# Patient Record
Sex: Female | Born: 1960 | Race: White | Hispanic: No | Marital: Married | Smoking: Never smoker
Health system: Southern US, Community
[De-identification: ages and names within clinical notes are randomized; demographics above are authoritative.]

## PROBLEM LIST (undated history)

## (undated) HISTORY — PX: ABDOMINAL HYSTERECTOMY: SHX81

## (undated) HISTORY — PX: PARATHYROIDECTOMY: SHX19

---

## 1998-06-23 ENCOUNTER — Other Ambulatory Visit: Admission: RE | Admit: 1998-06-23 | Discharge: 1998-06-23 | Payer: Self-pay | Admitting: Obstetrics and Gynecology

## 1998-07-11 ENCOUNTER — Encounter: Payer: Self-pay | Admitting: Obstetrics and Gynecology

## 1998-07-13 ENCOUNTER — Observation Stay (HOSPITAL_COMMUNITY): Admission: AD | Admit: 1998-07-13 | Discharge: 1998-07-14 | Payer: Self-pay | Admitting: Obstetrics and Gynecology

## 1999-10-02 ENCOUNTER — Encounter: Payer: Self-pay | Admitting: Obstetrics and Gynecology

## 1999-10-02 ENCOUNTER — Ambulatory Visit (HOSPITAL_COMMUNITY): Admission: RE | Admit: 1999-10-02 | Discharge: 1999-10-02 | Payer: Self-pay | Admitting: Obstetrics and Gynecology

## 2018-06-11 HISTORY — PX: REPLACEMENT TOTAL KNEE: SUR1224

## 2020-08-24 DIAGNOSIS — Z96651 Presence of right artificial knee joint: Secondary | ICD-10-CM | POA: Insufficient documentation

## 2020-08-24 DIAGNOSIS — S0501XA Injury of conjunctiva and corneal abrasion without foreign body, right eye, initial encounter: Secondary | ICD-10-CM | POA: Insufficient documentation

## 2020-08-24 DIAGNOSIS — B028 Zoster with other complications: Secondary | ICD-10-CM | POA: Insufficient documentation

## 2020-08-24 DIAGNOSIS — Z90710 Acquired absence of both cervix and uterus: Secondary | ICD-10-CM | POA: Insufficient documentation

## 2020-11-26 ENCOUNTER — Emergency Department (HOSPITAL_COMMUNITY)
Admission: EM | Admit: 2020-11-26 | Discharge: 2020-11-27 | Disposition: A | Discharge status: Home / Self Care | Payer: BC Managed Care – PPO | Attending: Emergency Medicine | Admitting: Emergency Medicine

## 2020-11-26 ENCOUNTER — Encounter (HOSPITAL_COMMUNITY): Payer: Self-pay | Admitting: *Deleted

## 2020-11-26 ENCOUNTER — Other Ambulatory Visit: Payer: Self-pay

## 2020-11-26 DIAGNOSIS — Y93H2 Activity, gardening and landscaping: Secondary | ICD-10-CM | POA: Insufficient documentation

## 2020-11-26 DIAGNOSIS — X58XXXA Exposure to other specified factors, initial encounter: Secondary | ICD-10-CM | POA: Insufficient documentation

## 2020-11-26 DIAGNOSIS — Y9289 Other specified places as the place of occurrence of the external cause: Secondary | ICD-10-CM | POA: Insufficient documentation

## 2020-11-26 DIAGNOSIS — S0501XA Injury of conjunctiva and corneal abrasion without foreign body, right eye, initial encounter: Secondary | ICD-10-CM | POA: Insufficient documentation

## 2020-11-26 DIAGNOSIS — S0591XA Unspecified injury of right eye and orbit, initial encounter: Secondary | ICD-10-CM | POA: Diagnosis present

## 2020-11-26 MED ORDER — TETRACAINE HCL 0.5 % OP SOLN
1.0000 [drp] | Freq: Once | OPHTHALMIC | Status: AC
  Administered 2020-11-27: 1 [drp] via OPHTHALMIC
  Filled 2020-11-26: qty 4

## 2020-11-26 MED ORDER — IBUPROFEN 400 MG PO TABS
600.0000 mg | ORAL_TABLET | Freq: Once | ORAL | Status: AC
  Administered 2020-11-26: 600 mg via ORAL
  Filled 2020-11-26: qty 1

## 2020-11-26 MED ORDER — FLUORESCEIN SODIUM 1 MG OP STRP
1.0000 | ORAL_STRIP | Freq: Once | OPHTHALMIC | Status: AC
  Administered 2020-11-27: 1 via OPHTHALMIC
  Filled 2020-11-26: qty 1

## 2020-11-26 NOTE — ED Provider Notes (Addendum)
Emergency Medicine Provider Triage Evaluation Note  Brianna Morgan , a 60 y.o. female  was evaluated in triage.  Pt complains of right eye pain, irritation, watering, and desire to keep the eye closed since 530 this morning when her granddaughter threw dirt into her eye.  Patient with history of corneal ulcer last year with delayed healing, had healed at this time.  Denies any blurry vision from the eye, but prefers to keep the eye closed.  Denies photosensitivity.  Review of Systems  Positive: Irritation, pain, and watering of the right eye Negative: Blurry vision, double vision, loss of vision  Physical Exam  BP (!) 165/97 (BP Location: Left Arm)   Pulse 76   Temp 97.9 F (36.6 C) (Oral)   Resp 17   Ht 5\' 10"  (1.778 m)   Wt 81.6 kg   SpO2 98%   BMI 25.83 kg/m  Gen:   Awake, no distress   Resp:  Normal effort  MSK:   Moves extremities without difficulty  Other:  Scleral injection as well as conjunctival injection of the right eye, eye is watering and patient prefers to keep the eye closed. PERRL, EOMI.  Medical Decision Making  Medically screening exam initiated at 11:12 PM.  Appropriate orders placed.  KRISTILYN COLTRANE was informed that the remainder of the evaluation will be completed by another provider, this initial triage assessment does not replace that evaluation, and the importance of remaining in the ED until their evaluation is complete.  This chart was dictated using voice recognition software, Dragon. Despite the best efforts of this provider to proofread and correct errors, errors may still occur which can change documentation meaning.    Havery Moros, PA-C 11/26/20 2314    Oluwaferanmi Wain, 11/28/20, PA-C 11/26/20 2315    11/28/20, MD 11/27/20 1123

## 2020-11-26 NOTE — ED Triage Notes (Signed)
The pt has a possible fb to the eye  sirt was thrown and some of it  struck her rt eye  pain and tearing since this occurrwd at [phone]

## 2020-11-27 MED ORDER — ACETAMINOPHEN 325 MG PO TABS
650.0000 mg | ORAL_TABLET | Freq: Once | ORAL | Status: AC
  Administered 2020-11-27: 650 mg via ORAL
  Filled 2020-11-27: qty 2

## 2020-11-27 MED ORDER — ERYTHROMYCIN 5 MG/GM OP OINT
1.0000 "application " | TOPICAL_OINTMENT | Freq: Once | OPHTHALMIC | Status: AC
  Administered 2020-11-27: 1 via OPHTHALMIC
  Filled 2020-11-27: qty 3.5

## 2020-11-27 NOTE — ED Provider Notes (Signed)
[facility] EMERGENCY DEPARTMENT Provider Note   CSN: [id number] Arrival date & time: 11/26/20  2150     History Chief Complaint  Patient presents with   Eye Pain    Brianna Morgan is a 60 y.o. female past medical history of corneal ulcer, presenting for evaluation of right eye irritation that began yesterday.  She states she has had abrasion to this right eye in the past which had prolonged healing.  Yesterday she was planting flowers with her granddaughter when somehow dirt flew up and landed in her right eye.  She states she was able to irrigate her eye immediately.  She was doing well after the incident, however after cooking dinner yesterday evening she developed worsening eye irritation.  She states now it feels gritty and sandy and very irritated.  It feels better when her eyes closed.  She had some mild headache last night though that resolved with ibuprofen.  That headache was not any different and was less severe than her chronic migraine headache.  She does not wear contact lenses.  She has an ophthalmologist, goes to [facility]  The history is provided by the patient.      History reviewed. No pertinent past medical history.  There are no problems to display for this patient.   History reviewed. No pertinent surgical history.   OB History   No obstetric history on file.     No family history on file.  Social History   Tobacco Use   Smoking status: Never    Passive exposure: Never  Substance Use Topics   Alcohol use: Yes    Home Medications Prior to Admission medications   Not on File    Allergies    Patient has no known allergies.  Review of Systems   Review of Systems  Eyes:  Positive for pain and redness. Negative for photophobia.  Neurological:  Positive for headaches.   Physical Exam Updated Vital Signs BP (!) 153/87   Pulse 69   Temp 97.9 F (36.6 C) (Oral)   Resp 18   Ht 5\' 10"  (1.778 m)   Wt 81.6 kg    SpO2 97%   BMI 25.83 kg/m   Physical Exam Vitals and nursing note reviewed.  Constitutional:      Appearance: She is well-developed.  HENT:     Head: Normocephalic and atraumatic.  Eyes:     Extraocular Movements: Extraocular movements intact.     Pupils: Pupils are equal, round, and reactive to light.     Comments: Right eye with conjunctival injection noted.  Tearing.  No periorbital edema or erythema. Right eye was visualized under Woods lamp with fluorescein stain.  There is uptake noted over the pupil about 3 mm x 2 mm in size.  No obvious foreign bodies visualized.  Cardiovascular:     Rate and Rhythm: Normal rate.  Pulmonary:     Effort: Pulmonary effort is normal.  Neurological:     Mental Status: She is alert.  Psychiatric:        Mood and Affect: Mood normal.        Behavior: Behavior normal.    ED Results / Procedures / Treatments   Labs (all labs ordered are listed, but only abnormal results are displayed) Labs Reviewed - No data to display  EKG None  Radiology No results found.  Procedures Procedures   Medications Ordered in ED Medications  erythromycin ophthalmic ointment 1 application (has no administration in  time range)  ibuprofen (ADVIL) tablet 600 mg (600 mg Oral Given 11/26/20 2332)  fluorescein ophthalmic strip 1 strip (1 strip Right Eye Given 11/27/20 0706)  tetracaine (PONTOCAINE) 0.5 % ophthalmic solution 1 drop (1 drop Right Eye Given 11/27/20 0706)  acetaminophen (TYLENOL) tablet 650 mg (650 mg Oral Given 11/27/20 0801)    ED Course  I have reviewed the triage vital signs and the nursing notes.  Pertinent labs & imaging results that were available during my care of the patient were reviewed by me and considered in my medical decision making (see chart for details).    MDM Rules/Calculators/A&P                          Patient with foreign body sensation and irritation to right eye after some dirt flew into her eye yesterday.  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  States that overall has been healing.  She does not wear contact lenses.  Exam shows uptake in the center of the cornea.  No obvious foreign body visualized.  There is some decreased visual acuity in the right eye though this is expected considering location of abrasion/chronic corneal ulcer.  Reports gritty sensation, eye is irrigated at the bedside.  Will cover with erythromycin ointment and instructed she call her ophthalmologist tomorrow for close follow-up.  She is in agreement with this plan, discharged in no distress.  Discussed results, findings, treatment and follow up. Patient advised of return precautions. Patient verbalized understanding and agreed with plan.  Final Clinical Impression(s) / ED Diagnoses Final diagnoses:  Abrasion of right cornea, initial encounter    Rx / DC Orders ED Discharge Orders     None        Zonie Crutcher, [place], PA-C 11/27/20 0847    Shon Baton, MD 11/28/20 [phone]

## 2020-11-27 NOTE — ED Notes (Signed)
Visual acuity completed: 20/32 in R eye, 20/20 L eye and 20/20 bilateral.

## 2020-11-27 NOTE — Discharge Instructions (Addendum)
Apply a warm compress to your right eye multiple times per day. Apply 1/2 inch ribbon of antibiotic ointment to your eye 4 times daily for 5 days. Schedule an urgent appointment with your eye doctor for close follow-up. Return to the emergency department if you develop thick yellow/greenish discharge, loss of vision, or new or worsening symptoms.

## 2021-08-18 ENCOUNTER — Other Ambulatory Visit (HOSPITAL_BASED_OUTPATIENT_CLINIC_OR_DEPARTMENT_OTHER): Payer: Self-pay | Admitting: Ophthalmology

## 2021-08-18 DIAGNOSIS — H0520 Unspecified exophthalmos: Secondary | ICD-10-CM

## 2021-08-22 ENCOUNTER — Ambulatory Visit (HOSPITAL_BASED_OUTPATIENT_CLINIC_OR_DEPARTMENT_OTHER): Admission: RE | Admit: 2021-08-22 | Payer: BC Managed Care – PPO | Source: Ambulatory Visit

## 2021-08-28 ENCOUNTER — Encounter (HOSPITAL_BASED_OUTPATIENT_CLINIC_OR_DEPARTMENT_OTHER): Payer: Self-pay | Admitting: Emergency Medicine

## 2021-08-28 ENCOUNTER — Emergency Department (HOSPITAL_BASED_OUTPATIENT_CLINIC_OR_DEPARTMENT_OTHER)
Admission: EM | Admit: 2021-08-28 | Discharge: 2021-08-28 | Disposition: A | Discharge status: Home / Self Care | Payer: BC Managed Care – PPO | Attending: Emergency Medicine | Admitting: Emergency Medicine

## 2021-08-28 ENCOUNTER — Other Ambulatory Visit: Payer: Self-pay

## 2021-08-28 ENCOUNTER — Other Ambulatory Visit (HOSPITAL_BASED_OUTPATIENT_CLINIC_OR_DEPARTMENT_OTHER): Payer: Self-pay

## 2021-08-28 DIAGNOSIS — Z23 Encounter for immunization: Secondary | ICD-10-CM | POA: Insufficient documentation

## 2021-08-28 DIAGNOSIS — S61211A Laceration without foreign body of left index finger without damage to nail, initial encounter: Secondary | ICD-10-CM | POA: Insufficient documentation

## 2021-08-28 DIAGNOSIS — W540XXA Bitten by dog, initial encounter: Secondary | ICD-10-CM | POA: Insufficient documentation

## 2021-08-28 DIAGNOSIS — S6992XA Unspecified injury of left wrist, hand and finger(s), initial encounter: Secondary | ICD-10-CM | POA: Diagnosis present

## 2021-08-28 MED ORDER — TETANUS-DIPHTH-ACELL PERTUSSIS 5-2.5-18.5 LF-MCG/0.5 IM SUSY
0.5000 mL | PREFILLED_SYRINGE | Freq: Once | INTRAMUSCULAR | Status: AC
  Administered 2021-08-28: 0.5 mL via INTRAMUSCULAR
  Filled 2021-08-28: qty 0.5

## 2021-08-28 MED ORDER — AMOXICILLIN-POT CLAVULANATE 875-125 MG PO TABS
1.0000 | ORAL_TABLET | Freq: Two times a day (BID) | ORAL | 0 refills | Status: DC
  Filled 2021-08-28: qty 14, 7d supply, fill #0

## 2021-08-28 NOTE — ED Notes (Signed)
Pt discharged to home. Discharge instructions have been discussed with patient and/or family members. Pt verbally acknowledges understanding d/c instructions, and endorses comprehension to checkout at registration before leaving.  °

## 2021-08-28 NOTE — ED Notes (Signed)
ED Provider at bedside. 

## 2021-08-28 NOTE — ED Triage Notes (Signed)
Pt arrives pov, steady gait with c/o dog bite to left index finger today. Animal utd on vaccinations per patient ?

## 2021-08-28 NOTE — ED Notes (Signed)
Pt irrigating left index finger per EDP order ?

## 2021-08-30 NOTE — ED Provider Notes (Signed)
?[facility] EMERGENCY DEPARTMENT ?Provider Note ? ? ?CSN: [id number] ?Arrival date & time: 08/28/21  8185 ? ?  ? ?History ? ?Chief Complaint  ?Patient presents with  ? Animal Bite  ? ? ?Brianna Morgan is a 61 y.o. female. ? ?HPI ? ?  ? ?61yo female presents with concern for dog bite.  Her puppy and older dog were fighting and she was in the middle. Had a grazing bite injury.  (Not crushing) to herleft index finger.  Also had a scratch on her left lower extremity. No numbness. Last tdap was more than 5 years ago.  Dogs UTD on vaccinations, rabies vaccinations. No other injuries or concerns. ? ? ? ?History reviewed. No pertinent past medical history.  ? ?Home Medications ?Prior to Admission medications   ?Medication Sig Start Date End Date Taking? Authorizing Provider  ?amoxicillin-clavulanate (AUGMENTIN) 875-125 MG tablet Take 1 tablet by mouth every 12 (twelve) hours for 7 days. 08/28/21 09/04/21 Yes Alvira Monday, MD  ?   ? ?Allergies    ?Patient has no known allergies.   ? ?Review of Systems   ?Review of Systems ? ?Physical Exam ?Updated Vital Signs ?BP (!) 161/91 (BP Location: Right Arm)   Pulse (!) 55   Temp 98 ?F (36.7 ?C) (Oral)   Resp 20   Ht 5\' 10"  (1.778 m)   Wt 83 kg   SpO2 99%   BMI 26.26 kg/m?  ?Physical Exam ?Vitals and nursing note reviewed.  ?Constitutional:   ?   General: She is not in acute distress. ?   Appearance: Normal appearance. She is not ill-appearing, toxic-appearing or diaphoretic.  ?HENT:  ?   Head: Normocephalic.  ?Eyes:  ?   Conjunctiva/sclera: Conjunctivae normal.  ?Cardiovascular:  ?   Rate and Rhythm: Normal rate and regular rhythm.  ?   Pulses: Normal pulses.  ?Pulmonary:  ?   Effort: Pulmonary effort is normal. No respiratory distress.  ?Musculoskeletal:     ?   General: No deformity or signs of injury.  ?   Cervical back: No rigidity.  ?Skin: ?   General: Skin is warm and dry.  ?   Coloration: Skin is not jaundiced or pale.  ?   Comments: Left index finger with  lacerations, prox phalanx, mi, distal ?Normal flexion and extension to fingers, normal capillary refill and sensation  ?Neurological:  ?   General: No focal deficit present.  ?   Mental Status: She is alert and oriented to person, place, and time.  ? ? ?ED Results / Procedures / Treatments   ?Labs ?(all labs ordered are listed, but only abnormal results are displayed) ?Labs Reviewed - No data to display ? ?EKG ?None ? ?Radiology ?No results found. ? ?Procedures ?Procedures  ? ? ?Medications Ordered in ED ?Medications  ?Tdap (BOOSTRIX) injection 0.5 mL (0.5 mLs Intramuscular Given 08/28/21 0841)  ? ? ?ED Course/ Medical Decision Making/ A&P ?  ?                        ?Medical Decision Making ?Risk ?Prescription drug management. ? ? ?61yo female presents with concern for dog bite. Doubt fracture by mechanism. Dogs UTD on vaccines.  TDaP updated.  ? ?Wound irrigated with continuous tap water, iodine placed, dressing placed.  Wounds not gaping and given mechanism do not recommend closure due to risk of infection. Given rx for augmentin, recommend hand surgery follow up as needed if concerns, return to the  ED if worsening infectious symptoms. Patient discharged in stable condition with understanding of reasons to return.  ? ? ? ? ? ? ? ?Final Clinical Impression(s) / ED Diagnoses ?Final diagnoses:  ?Dog bite, initial encounter  ?Laceration of left index finger without foreign body without damage to nail, initial encounter  ? ? ?Rx / DC Orders ?ED Discharge Orders   ? ?      Ordered  ?  amoxicillin-clavulanate (AUGMENTIN) 875-125 MG tablet  Every 12 hours       ? 08/28/21 0752  ? ?  ?  ? ?  ? ? ?  ?Alvira Monday, MD ?08/30/21 2200 ? ?

## 2021-09-03 ENCOUNTER — Other Ambulatory Visit: Payer: Self-pay

## 2021-09-03 ENCOUNTER — Emergency Department (HOSPITAL_BASED_OUTPATIENT_CLINIC_OR_DEPARTMENT_OTHER)
Admission: EM | Admit: 2021-09-03 | Discharge: 2021-09-03 | Disposition: A | Discharge status: Home / Self Care | Payer: BC Managed Care – PPO | Attending: Emergency Medicine | Admitting: Emergency Medicine

## 2021-09-03 ENCOUNTER — Encounter (HOSPITAL_BASED_OUTPATIENT_CLINIC_OR_DEPARTMENT_OTHER): Payer: Self-pay

## 2021-09-03 DIAGNOSIS — Z4800 Encounter for change or removal of nonsurgical wound dressing: Secondary | ICD-10-CM | POA: Insufficient documentation

## 2021-09-03 DIAGNOSIS — Z5189 Encounter for other specified aftercare: Secondary | ICD-10-CM

## 2021-09-03 MED ORDER — AMOXICILLIN-POT CLAVULANATE 875-125 MG PO TABS: 1.0000 | ORAL_TABLET | Freq: Two times a day (BID) | ORAL | 0 refills | Status: DC

## 2021-09-03 NOTE — Discharge Instructions (Signed)
Finish your current Augmentin prescription and you can fill the other 1 for an additional 5 days.  Please call hand please have a follow-up appointment.  Return to the emergency department for worsening symptoms. ?

## 2021-09-03 NOTE — ED Notes (Signed)
Pt verbalized understanding to pick up prescriptions at pharmacy listed on d/c instructions. Wound margins marked with skin marker prior to d/c ?

## 2021-09-03 NOTE — ED Triage Notes (Signed)
Pt was seen here after her dog bit her on 3/20. Has been on Augmentin. Finger increasing in swelling, discharge and pain. ?

## 2021-09-03 NOTE — ED Provider Notes (Signed)
?[facility] EMERGENCY DEPARTMENT ?Provider Note ? ? ?CSN: [id number] ?Arrival date & time: 09/03/21  1626 ? ?  ? ?History ?Chief Complaint  ?Patient presents with  ? Wound Check  ? ? ?Brianna Morgan is a 61 y.o. female who presents to the emergency department for a wound check.  No fever or chills.  She has noticed increased amount of pain over the last 24 hours without any evidence of purulence. No new wounds. She has been taking her antibiotics as prescribed.  ? ? ?Wound Check ? ? ?  ? ?Home Medications ?Prior to Admission medications   ?Medication Sig Start Date End Date Taking? Authorizing Provider  ?amoxicillin-clavulanate (AUGMENTIN) 875-125 MG tablet Take 1 tablet by mouth every 12 (twelve) hours. 09/03/21  Yes Teressa Lower, PA-C  ?   ? ?Allergies    ?Patient has no known allergies.   ? ?Review of Systems   ?Review of Systems  ?All other systems reviewed and are negative. ? ?Physical Exam ?Updated Vital Signs ?BP (!) 140/92 (BP Location: Right Arm)   Pulse 76   Temp (!) 97.5 ?F (36.4 ?C) (Oral)   Resp 18   SpO2 100%  ?Physical Exam ?Vitals and nursing note reviewed.  ?Constitutional:   ?   Appearance: Normal appearance.  ?HENT:  ?   Head: Normocephalic and atraumatic.  ?Eyes:  ?   General:     ?   Right eye: No discharge.     ?   Left eye: No discharge.  ?   Conjunctiva/sclera: Conjunctivae normal.  ?Pulmonary:  ?   Effort: Pulmonary effort is normal.  ?Skin: ?   General: Skin is warm and dry.  ?   Findings: No rash.  ?   Comments: Left index finger shows well healing lacerations with no obvious purulence. There is minimal surrounding erythema. Minimal swelling. Full range of motion in the fingers. Good cap refill. Normal sensation. Good strength to flexion and extension.  ?Neurological:  ?   General: No focal deficit present.  ?   Mental Status: She is alert.  ?Psychiatric:     ?   Mood and Affect: Mood normal.     ?   Behavior: Behavior normal.  ? ? ?ED Results / Procedures / Treatments    ?Labs ?(all labs ordered are listed, but only abnormal results are displayed) ?Labs Reviewed - No data to display ? ?EKG ?None ? ?Radiology ?No results found. ? ?Procedures ?Procedures  ? ? ?Medications Ordered in ED ?Medications - No data to display ? ?ED Course/ Medical Decision Making/ A&P ?Clinical Course as of 09/03/21 2118  ?Wynelle Link Sep 03, 2021  ?1635 I discussed this case with my attending physician who cosigned this note including patient's presenting symptoms, physical exam, and planned diagnostics and interventions. Attending physician stated agreement with plan or made changes to plan which were implemented.  ? ?Attending physician assessed patient at bedside. ? ? [CF]  ?  ?Clinical Course User Index ?[CF] Honor Loh M, PA-C  ? ?                        ?Medical Decision Making ?Risk ?Prescription drug management. ? ? ?Brianna Morgan is a 61 y.o. female who presents to the ED for a wound check. Clinically, the wound does not appear to be grossly infected. Will likely extend Augmentin coverage another 5 days to ensure proper wound healing. Encouraged patient to continue using antibiotic ointment and  to keep area clean and dry. Strict return precautions given. She is safe for discharge.  ? ?Final Clinical Impression(s) / ED Diagnoses ?Final diagnoses:  ?Visit for wound check  ? ? ?Rx / DC Orders ?ED Discharge Orders   ? ?      Ordered  ?  amoxicillin-clavulanate (AUGMENTIN) 875-125 MG tablet  Every 12 hours       ? 09/03/21 1659  ? ?  ?  ? ?  ? ? ?  ?Teressa Lower, PA-C ?09/03/21 2118 ? ?  ?Milagros Loll, MD ?09/06/21 1326 ? ?

## 2021-09-04 ENCOUNTER — Emergency Department (HOSPITAL_BASED_OUTPATIENT_CLINIC_OR_DEPARTMENT_OTHER)
Admission: EM | Admit: 2021-09-04 | Discharge: 2021-09-04 | Disposition: A | Discharge status: Home / Self Care | Payer: BC Managed Care – PPO | Attending: Emergency Medicine | Admitting: Emergency Medicine

## 2021-09-04 ENCOUNTER — Other Ambulatory Visit: Payer: Self-pay

## 2021-09-04 ENCOUNTER — Encounter (HOSPITAL_BASED_OUTPATIENT_CLINIC_OR_DEPARTMENT_OTHER): Payer: Self-pay

## 2021-09-04 DIAGNOSIS — W540XXA Bitten by dog, initial encounter: Secondary | ICD-10-CM | POA: Diagnosis not present

## 2021-09-04 DIAGNOSIS — S61251A Open bite of left index finger without damage to nail, initial encounter: Secondary | ICD-10-CM | POA: Insufficient documentation

## 2021-09-04 DIAGNOSIS — S6992XA Unspecified injury of left wrist, hand and finger(s), initial encounter: Secondary | ICD-10-CM | POA: Diagnosis present

## 2021-09-04 DIAGNOSIS — S6992XD Unspecified injury of left wrist, hand and finger(s), subsequent encounter: Secondary | ICD-10-CM

## 2021-09-04 MED ORDER — CEFTRIAXONE SODIUM 1 G IJ SOLR
1.0000 g | Freq: Once | INTRAMUSCULAR | Status: AC
  Administered 2021-09-04: 1 g via INTRAMUSCULAR
  Filled 2021-09-04: qty 10

## 2021-09-04 NOTE — ED Triage Notes (Signed)
Pt reports dog bite left index finger 3/20-seen here 3/20 and yesterday-states she has appt with hand surgeon 3/29-states finger is worse-NAD-steadygait ?

## 2021-09-04 NOTE — Discharge Instructions (Signed)
Continue taking the antibiotics.  Follow-up with Dr. Arita Miss as planned.  Return to the ED if the erythema spreads beyond the markings on your hand. ?

## 2021-09-04 NOTE — ED Provider Notes (Signed)
?[facility] EMERGENCY DEPARTMENT ?Provider Note ? ? ?CSN: [id number] ?Arrival date & time: 09/04/21  1654 ? ?  ? ?History ? ?Chief Complaint  ?Patient presents with  ? Finger Injury  ? ? ?Brianna Morgan is a 61 y.o. female. ? ?HPI ? ?Patient is a 61 year old female presenting today with dog bite to left index finger.  This happened 3/20.  She has been on Augmentin, was seen yesterday for wound recheck and extended the Augmentin.  She has an appointment with hand surgery Dr. Arita Miss in 2 days.  She denies any fevers at home, she does feel like there is tightening.  She denies any spreading of the erythema from yesterday, she is able to make a fist move her finger without any issues. ? ?Home Medications ?Prior to Admission medications   ?Medication Sig Start Date End Date Taking? Authorizing Provider  ?amoxicillin-clavulanate (AUGMENTIN) 875-125 MG tablet Take 1 tablet by mouth every 12 (twelve) hours. 09/03/21   Teressa Lower, PA-C  ?   ? ?Allergies    ?Patient has no known allergies.   ? ?Review of Systems   ?Review of Systems ? ?Physical Exam ?Updated Vital Signs ?BP (!) 144/88 (BP Location: Right Arm)   Pulse 64   Temp 98 ?F (36.7 ?C) (Oral)   Resp 18   SpO2 99%  ?Physical Exam ?Vitals and nursing note reviewed. Exam conducted with a chaperone present.  ?Constitutional:   ?   General: She is not in acute distress. ?   Appearance: Normal appearance.  ?HENT:  ?   Head: Normocephalic and atraumatic.  ?Eyes:  ?   General: No scleral icterus. ?   Extraocular Movements: Extraocular movements intact.  ?   Pupils: Pupils are equal, round, and reactive to light.  ?Cardiovascular:  ?   Pulses: Normal pulses.  ?Musculoskeletal:     ?   General: Swelling present. Normal range of motion.  ?Skin: ?   General: Skin is warm.  ?   Coloration: Skin is not jaundiced.  ?   Findings: Erythema present.  ?Neurological:  ?   Mental Status: She is alert. Mental status is at baseline.  ?   Coordination: Coordination normal.   ? ? ? ?ED Results / Procedures / Treatments   ?Labs ?(all labs ordered are listed, but only abnormal results are displayed) ?Labs Reviewed - No data to display ? ?EKG ?None ? ?Radiology ?No results found. ? ?Procedures ?Procedures  ? ? ?Medications Ordered in ED ?Medications  ?cefTRIAXone (ROCEPHIN) injection 1 g (has no administration in time range)  ? ? ?ED Course/ Medical Decision Making/ A&P ?  ?                        ?Medical Decision Making ? ?This is a 61 year old female presenting today with dog bite from 3/20.  She is neurovascularly intact, brisk cap refill and radial pulses 2+.  She is able to make close fist, flexion and extension at the DIP and PIP are intact.  There is some warmth and erythema but is within the lines that were drawn yesterday.  I reviewed that ED note.  Patient's husband is at bedside also providing an independent history. ? ?Considered imaging but ultimately I do not feel this is progressed and she osteomyelitis.  There is no purulent discharge so I considered culture but do not think that is warranted given there is no purulent discharge. ? ?Differential diagnosis of course included tendon  flexor tenosynovitis, osteomyelitis, cellulitis, abscess.  Based on exam this could be normal stages of healing versus cellulitis.  I do think patient continue taking the Augmentin, will give a dose of Rocephin here in the ED.  I do not think she needs to come in for admission under observation, she has an appointment with hand surgery in 2 days and I think it appropriate for her to keep that appointment with strict return precautions.  Patient and husband agree with this plan.  Discharged in stable condition. ? ? ? ? ? ? ? ?Final Clinical Impression(s) / ED Diagnoses ?Final diagnoses:  ?None  ? ? ?Rx / DC Orders ?ED Discharge Orders   ? ? None  ? ?  ? ? ?  ?Theron Arista, PA-C ?09/04/21 1747 ? ?  ?Pricilla Loveless, MD ?09/14/21 0715 ? ?

## 2021-09-06 ENCOUNTER — Other Ambulatory Visit: Payer: Self-pay

## 2021-09-06 ENCOUNTER — Ambulatory Visit: Payer: BC Managed Care – PPO | Admitting: Plastic Surgery

## 2021-09-06 DIAGNOSIS — S6992XA Unspecified injury of left wrist, hand and finger(s), initial encounter: Secondary | ICD-10-CM | POA: Diagnosis not present

## 2021-09-06 NOTE — Progress Notes (Signed)
? ?  Referring Provider ?No referring provider defined for this encounter.  ? ?CC:  ?Chief Complaint  ?Patient presents with  ? Advice Only  ?   ? ?Brianna Morgan is an 61 y.o. female.  ?HPI: Patient presents as referral from the emergency room for left index finger dog bite injury.  She was breaking up a fight between 2 dogs.  She had a relatively superficial wound in the dorsal aspect of her index finger which was washed out the emergency room and not closed.  She was given antibiotics and sent to me for follow-up.  In the interim she did notice some worsening redness and erythema and return to the emergency room and got a Rocephin shot.  She has noticed over the past few days quite a bit of improvement in the redness and swelling. ? ?No Known Allergies ? ?Outpatient Encounter Medications as of 09/06/2021  ?Medication Sig  ? acyclovir (ZOVIRAX) 200 MG capsule Take by mouth as needed.  ? amoxicillin-clavulanate (AUGMENTIN) 875-125 MG tablet Take 1 tablet by mouth every 12 (twelve) hours.  ? calcium carbonate (OSCAL) 1500 (600 Ca) MG TABS tablet Take 1 tablet by mouth daily.  ? ?No facility-administered encounter medications on file as of 09/06/2021.  ?  ? ?No past medical history on file. ? ?Past Surgical History:  ?Procedure Laterality Date  ? ABDOMINAL HYSTERECTOMY    ? PARATHYROIDECTOMY    ? ? ?No family history on file. ? ?Social History  ? ?Social History Narrative  ? Not on file  ?  ? ?Review of Systems ?General: Denies fevers, chills, weight loss ?CV: Denies chest pain, shortness of breath, palpitations ? ?Physical Exam ? ?  09/04/2021  ?  6:27 PM 09/04/2021  ?  5:05 PM 09/03/2021  ?  4:41 PM  ?Vitals with BMI  ?Systolic 143 144 161  ?Diastolic 78 88 92  ?Pulse 68 64 76  ?  ?General:  No acute distress,  Alert and oriented, Non-Toxic, Normal speech and affect ?Left hand: Fingers well-perfused with normal cap refill palp radial pulse.  Sensation is intact throughout.  She has near full range of motion with  slight limitations in flexion of the injured index finger.  She has 1 cm superficial lacerations in the dorsal aspect of the finger distal and proximal to the PIP joint.  Mild surrounding erythema but no purulence and no fluctuance.  She can fully extend. ? ?Assessment/Plan ?Patient presents in follow-up from a superficial wound from a dog bite.  This seems to be heading in the right direction and do not see a need for surgical intervention at this time.  We discussed various reasons to come back including worsening erythema and swelling.  I do not suspect she is going to need any hand therapy as her function is already already quite good.  She like to follow-up on an as-needed basis and I think that is reasonable.  All of her questions were answered ? ?Brianna Morgan ?09/06/2021, 2:08 PM  ? ? ?  ?

## 2021-10-05 ENCOUNTER — Ambulatory Visit: Payer: BC Managed Care – PPO | Admitting: Nurse Practitioner

## 2021-10-05 VITALS — BP 124/72 | HR 75 | Temp 98.2°F | Ht 69.61 in | Wt 185.0 lb

## 2021-10-05 DIAGNOSIS — I7090 Unspecified atherosclerosis: Secondary | ICD-10-CM | POA: Diagnosis not present

## 2021-10-05 DIAGNOSIS — K7689 Other specified diseases of liver: Secondary | ICD-10-CM

## 2021-10-05 DIAGNOSIS — E663 Overweight: Secondary | ICD-10-CM | POA: Diagnosis not present

## 2021-10-05 DIAGNOSIS — Z1231 Encounter for screening mammogram for malignant neoplasm of breast: Secondary | ICD-10-CM | POA: Insufficient documentation

## 2021-10-05 DIAGNOSIS — Z8639 Personal history of other endocrine, nutritional and metabolic disease: Secondary | ICD-10-CM | POA: Insufficient documentation

## 2021-10-05 DIAGNOSIS — Z13220 Encounter for screening for lipoid disorders: Secondary | ICD-10-CM | POA: Insufficient documentation

## 2021-10-05 NOTE — Patient Instructions (Signed)
Mediterranean Diet ?A Mediterranean diet refers to food and lifestyle choices that are based on the traditions of countries located on the [place]. It focuses on eating more fruits, vegetables, whole grains, beans, nuts, seeds, and heart-healthy fats, and eating less dairy, meat, eggs, and processed foods with added sugar, salt, and fat. This way of eating has been shown to help prevent certain conditions and improve outcomes for people who have chronic diseases, like kidney disease and heart disease. ?What are tips for following this plan? ?Reading food labels ?Check the serving size of packaged foods. For foods such as rice and pasta, the serving size refers to the amount of cooked product, not dry. ?Check the total fat in packaged foods. Avoid foods that have saturated fat or trans fats. ?Check the ingredient list for added sugars, such as corn syrup. ?Shopping ? ?Buy a variety of foods that offer a balanced diet, including: ?Fresh fruits and vegetables (produce). ?Grains, beans, nuts, and seeds. Some of these may be available in unpackaged forms or large amounts (in bulk). ?Fresh seafood. ?Poultry and eggs. ?Low-fat dairy products. ?Buy whole ingredients instead of prepackaged foods. ?Buy fresh fruits and vegetables in-season from local farmers markets. ?Buy plain frozen fruits and vegetables. ?If you do not have access to quality fresh seafood, buy precooked frozen shrimp or canned fish, such as tuna, salmon, or sardines. ?Stock your pantry so you always have certain foods on hand, such as olive oil, canned tuna, canned tomatoes, rice, pasta, and beans. ?Cooking ?Cook foods with extra-virgin olive oil instead of using butter or other vegetable oils. ?Have meat as a side dish, and have vegetables or grains as your main dish. This means having meat in small portions or adding small amounts of meat to foods like pasta or stew. ?Use beans or vegetables instead of meat in common dishes like chili or  lasagna. ?Experiment with different cooking methods. Try roasting, broiling, steaming, and saut?ing vegetables. ?Add frozen vegetables to soups, stews, pasta, or rice. ?Add nuts or seeds for added healthy fats and plant protein at each meal. You can add these to yogurt, salads, or vegetable dishes. ?Marinate fish or vegetables using olive oil, lemon juice, garlic, and fresh herbs. ?Meal planning ?Plan to eat one vegetarian meal one day each week. Try to work up to two vegetarian meals, if possible. ?Eat seafood two or more times a week. ?Have healthy snacks readily available, such as: ?Vegetable sticks with hummus. ?Greek yogurt. ?Fruit and nut trail mix. ?Eat balanced meals throughout the week. This includes: ?Fruit: 2-3 servings a day. ?Vegetables: 4-5 servings a day. ?Low-fat dairy: 2 servings a day. ?Fish, poultry, or lean meat: 1 serving a day. ?Beans and legumes: 2 or more servings a week. ?Nuts and seeds: 1-2 servings a day. ?Whole grains: 6-8 servings a day. ?Extra-virgin olive oil: 3-4 servings a day. ?Limit red meat and sweets to only a few servings a month. ?Lifestyle ? ?Cook and eat meals together with your family, when possible. ?Drink enough fluid to keep your urine pale yellow. ?Be physically active every day. This includes: ?Aerobic exercise like running or swimming. ?Leisure activities like gardening, walking, or housework. ?Get 7-8 hours of sleep each night. ?If recommended by your health care provider, drink red wine in moderation. This means 1 glass a day for nonpregnant women and 2 glasses a day for men. A glass of wine equals 5 oz (150 mL). ?What foods should I eat? ?Fruits ?Apples. Apricots. Avocado. Berries. Bananas. Cherries. Dates.   Figs. Grapes. Lemons. Melon. Oranges. Peaches. Plums. Pomegranate. ?Vegetables ?Artichokes. Beets. Broccoli. Cabbage. Carrots. Eggplant. Green beans. Chard. Kale. Spinach. Onions. Leeks. Peas. Squash. Tomatoes. Peppers. Radishes. ?Grains ?Whole-grain pasta. Brown  rice. Bulgur wheat. Polenta. Couscous. Whole-wheat bread. Oatmeal. Quinoa. ?Meats and other proteins ?Beans. Almonds. Sunflower seeds. Pine nuts. Peanuts. Cod. Salmon. Scallops. Shrimp. Tuna. Tilapia. Clams. Oysters. Eggs. Poultry without skin. ?Dairy ?Low-fat milk. Cheese. Greek yogurt. ?Fats and oils ?Extra-virgin olive oil. Avocado oil. Grapeseed oil. ?Beverages ?Water. Red wine. Herbal tea. ?Sweets and desserts ?Greek yogurt with honey. Baked apples. Poached pears. Trail mix. ?Seasonings and condiments ?Basil. Cilantro. Coriander. Cumin. Mint. Parsley. Sage. Rosemary. Tarragon. Garlic. Oregano. Thyme. Pepper. Balsamic vinegar. Tahini. Hummus. Tomato sauce. Olives. Mushrooms. ?The items listed above may not be a complete list of foods and beverages you can eat. Contact a dietitian for more information. ?What foods should I limit? ?This is a list of foods that should be eaten rarely or only on special occasions. ?Fruits ?Fruit canned in syrup. ?Vegetables ?Deep-fried potatoes (french fries). ?Grains ?Prepackaged pasta or rice dishes. Prepackaged cereal with added sugar. Prepackaged snacks with added sugar. ?Meats and other proteins ?Beef. Pork. Lamb. Poultry with skin. Hot dogs. Bacon. ?Dairy ?Ice cream. Sour cream. Whole milk. ?Fats and oils ?Butter. Canola oil. Vegetable oil. Beef fat (tallow). Lard. ?Beverages ?Juice. Sugar-sweetened soft drinks. Beer. Liquor and spirits. ?Sweets and desserts ?Cookies. Cakes. Pies. Candy. ?Seasonings and condiments ?Mayonnaise. Pre-made sauces and marinades. ?The items listed above may not be a complete list of foods and beverages you should limit. Contact a dietitian for more information. ?Summary ?The Mediterranean diet includes both food and lifestyle choices. ?Eat a variety of fresh fruits and vegetables, beans, nuts, seeds, and whole grains. ?Limit the amount of red meat and sweets that you eat. ?If recommended by your health care provider, drink red wine in moderation.  This means 1 glass a day for nonpregnant women and 2 glasses a day for men. A glass of wine equals 5 oz (150 mL). ?This information is not intended to replace advice given to you by your health care provider. Make sure you discuss any questions you have with your health care provider. ?Document Revised: 07/03/2019 Document Reviewed: 04/30/2019 ?Elsevier Patient Education ? 2023 Elsevier Inc. ? ?

## 2021-10-05 NOTE — Assessment & Plan Note (Signed)
No recommendations on CT scan were made regarding follow-up imaging.  We will check metabolic panel, if liver enzymes are elevated will consider ultrasound of liver.   ?

## 2021-10-05 NOTE — Assessment & Plan Note (Signed)
We will check A1c today for further evaluation. ?

## 2021-10-05 NOTE — Assessment & Plan Note (Signed)
Referral to breast center for screening mammogram ordered today. ?

## 2021-10-05 NOTE — Assessment & Plan Note (Signed)
Information regarding Mediterranean diet provided to patient today.  We will check blood work for further evaluation. ?

## 2021-10-05 NOTE — Assessment & Plan Note (Signed)
We discussed findings on CT scan and she is motivated to focus on lifestyle measures aimed at reducing risk of heart attack or stroke.  I recommended she consider adjusting her diet to Mediterranean style type diet.  She reports that she generally eats healthy but we will see what changes she can make to improve her diet.  We will also check lipid panel for further evaluation.  May consider statin therapy in the future if needed. ?

## 2021-10-05 NOTE — Progress Notes (Signed)
? ? ? ?Subjective:  ?Patient ID: Brianna Morgan, female    DOB: 1961/05/25  Age: 61 y.o. MRN: [id number] ? ?CC:  ?Chief Complaint  ?Patient presents with  ? new patient  ?  ? ? ?HPI  ?This patient arrives today for the above. She is accompanied by her spouse. They recently moved to this area from [place] approximately 6 months ago.  ? ?She brings some medical records with her for me to review. Had thyroid  testing about 1 month ago showed TSH of 3.0, free T4 of 1.1.  She has a history of parathyroidectomy for treatment of hyperparathyroidism and hypercalcemia in the past.  She had colonoscopy in August 2022 which showed polyps, diverticula, and a submucosal mass in the sigmoid that was biopsied.  All biopsies came back negative for malignancy.  She underwent CT scan for further evaluation and this did not show any lesions.  CT scan did show 2 low-density lesions of her liver which radiologist read was likely cystic.  Additionally she was noted to have aortic atherosclerosis present.  She was told to follow-up and have repeat colonoscopy in 3 years.  She denies any current abdominal pain, bright red blood per rectum, or melena. ? ?She has no other acute complaints today.  She believes she has had elevated blood sugar in the past, she thinks she may have had an A1c of 7.2 before.  She believes she is due for screening mammogram next month. ? ?No past medical history on file. ? ? ? ?No family history on file. ? ?Social History  ? ?Social History Narrative  ? Not on file  ? ?Social History  ? ?Tobacco Use  ? Smoking status: Never  ? Smokeless tobacco: Never  ?Substance Use Topics  ? Alcohol use: Yes  ?  Comment: occ  ? ? ? ?Current Meds  ?Medication Sig  ? acyclovir (ZOVIRAX) 200 MG capsule Take 500 mg by mouth as needed.  ? diphenhydramine-acetaminophen (TYLENOL PM) 25-500 MG TABS tablet Take 1 tablet by mouth at bedtime as needed.  ? psyllium (METAMUCIL) 58.6 % powder Take 1 packet by mouth 3 (three) times  daily.  ? vitamin C (ASCORBIC ACID) 500 MG tablet Take 500 mg by mouth daily.  ? ? ?ROS:  ?Review of Systems  ?Respiratory:  Negative for shortness of breath.   ?Cardiovascular:  Negative for chest pain.  ?Gastrointestinal:  Negative for abdominal pain and blood in stool.  ?Psychiatric/Behavioral:  Negative for depression and suicidal ideas.   ? ? ?Objective:  ? ?Today's Vitals: BP 124/72 (BP Location: Right Arm, Patient Position: Sitting, Cuff Size: Large)   Pulse 75   Temp 98.2 ?F (36.8 ?C) (Oral)   Ht 5' 9.61" (1.768 m)   Wt 185 lb (83.9 kg)   SpO2 96%   BMI 26.85 kg/m?  ? ?  10/05/2021  ?  1:41 PM 09/04/2021  ?  6:27 PM 09/04/2021  ?  5:05 PM  ?Vitals with BMI  ?Height 5' 9.606"    ?Weight 185 lbs    ?BMI 26.85    ?Systolic [id number]  ?Diastolic 72 78 88  ?Pulse 75 68 64  ?  ? ?Physical Exam ?Vitals reviewed.  ?Constitutional:   ?   General: She is not in acute distress. ?   Appearance: Normal appearance.  ?HENT:  ?   Head: Normocephalic and atraumatic.  ?Neck:  ?   Vascular: No carotid bruit.  ?Cardiovascular:  ?   Rate and Rhythm:  Normal rate and regular rhythm.  ?   Pulses: Normal pulses.  ?   Heart sounds: Normal heart sounds.  ?Pulmonary:  ?   Effort: Pulmonary effort is normal.  ?   Breath sounds: Normal breath sounds.  ?Skin: ?   General: Skin is warm and dry.  ?Neurological:  ?   General: No focal deficit present.  ?   Mental Status: She is alert and oriented to person, place, and time.  ?Psychiatric:     ?   Mood and Affect: Mood normal.     ?   Behavior: Behavior normal.     ?   Judgment: Judgment normal.  ? ? ? ? ? ? ? ?Assessment and Plan  ? ?1. Atherosclerosis   ?2. History of elevated glucose   ?3. Screening, lipid   ?4. Overweight (BMI 25.0-29.9)   ?5. Liver cyst   ?6. Encounter for screening mammogram for malignant neoplasm of breast   ? ? ? ?Plan: ?See plan via problem list below. ? ? ?Tests ordered ?Orders Placed This Encounter  ?Procedures  ? MM DIGITAL SCREENING BILATERAL  ? Hemoglobin  A1c  ? Lipid panel  ? Comprehensive metabolic panel  ? CBC  ? ? ? ? ?No orders of the defined types were placed in this encounter. ? ? ?Patient to follow-up in 1 month for annual physical exam, or sooner as needed. ? ?Ailene Ards, NP ? ?

## 2021-10-05 NOTE — Assessment & Plan Note (Signed)
Screening lipid panel ordered today, patient will return to office tomorrow or next week in a fasting state to have this collected.  Further recommendations may be made based upon these results. ?

## 2021-10-05 NOTE — Assessment & Plan Note (Signed)
Status post parathyroidectomy.  We will check metabolic panel to monitor calcium level, further recommendations may be made based upon these results. ?

## 2021-10-06 ENCOUNTER — Encounter: Payer: Self-pay | Admitting: Nurse Practitioner

## 2021-10-06 ENCOUNTER — Other Ambulatory Visit (INDEPENDENT_AMBULATORY_CARE_PROVIDER_SITE_OTHER): Payer: BC Managed Care – PPO

## 2021-10-06 DIAGNOSIS — K7689 Other specified diseases of liver: Secondary | ICD-10-CM

## 2021-10-06 DIAGNOSIS — Z8639 Personal history of other endocrine, nutritional and metabolic disease: Secondary | ICD-10-CM | POA: Diagnosis not present

## 2021-10-06 DIAGNOSIS — E663 Overweight: Secondary | ICD-10-CM | POA: Diagnosis not present

## 2021-10-06 DIAGNOSIS — I7090 Unspecified atherosclerosis: Secondary | ICD-10-CM

## 2021-10-06 DIAGNOSIS — Z13220 Encounter for screening for lipoid disorders: Secondary | ICD-10-CM

## 2021-10-06 DIAGNOSIS — Z1231 Encounter for screening mammogram for malignant neoplasm of breast: Secondary | ICD-10-CM

## 2021-10-06 LAB — COMPREHENSIVE METABOLIC PANEL
ALT: 15 U/L (ref 0–35)
AST: 15 U/L (ref 0–37)
Albumin: 4.2 g/dL (ref 3.5–5.2)
Alkaline Phosphatase: 60 U/L (ref 39–117)
BUN: 14 mg/dL (ref 6–23)
CO2: 30 mEq/L (ref 19–32)
Calcium: 9.9 mg/dL (ref 8.4–10.5)
Chloride: 105 mEq/L (ref 96–112)
Creatinine, Ser: 0.77 mg/dL (ref 0.40–1.20)
GFR: 82.24 mL/min (ref >60.00)
Glucose, Bld: 106 mg/dL — ABNORMAL HIGH (ref 70–99)
Potassium: 4.4 mEq/L (ref 3.5–5.1)
Sodium: 139 mEq/L (ref 135–145)
Total Bilirubin: 0.9 mg/dL (ref 0.2–1.2)
Total Protein: 6.5 g/dL (ref 6.0–8.3)

## 2021-10-06 LAB — CBC
HCT: 38.5 % (ref 36.0–46.0)
Hemoglobin: 12.8 g/dL (ref 12.0–15.0)
MCHC: 33.3 g/dL (ref 30.0–36.0)
MCV: 89.9 fl (ref 78.0–100.0)
Platelets: 203 10*3/uL (ref 150.0–400.0)
RBC: 4.28 Mil/uL (ref 3.87–5.11)
RDW: 13.1 % (ref 11.5–15.5)
WBC: 5.8 10*3/uL (ref 4.0–10.5)

## 2021-10-06 LAB — LIPID PANEL
Cholesterol: 192 mg/dL (ref 0–200)
HDL: 55.1 mg/dL (ref >39.00)
LDL Cholesterol: 123 mg/dL — ABNORMAL HIGH (ref 0–99)
NonHDL: 137.02
Total CHOL/HDL Ratio: 3
Triglycerides: 69 mg/dL (ref 0.0–149.0)
VLDL: 13.8 mg/dL (ref 0.0–40.0)

## 2021-10-06 LAB — HEMOGLOBIN A1C: Hgb A1c MFr Bld: 5.8 % (ref 4.6–6.5)

## 2021-10-16 ENCOUNTER — Ambulatory Visit
Admission: RE | Admit: 2021-10-16 | Discharge: 2021-10-16 | Disposition: A | Discharge status: Home / Self Care | Payer: BC Managed Care – PPO | Source: Ambulatory Visit | Attending: Nurse Practitioner | Admitting: Nurse Practitioner

## 2021-10-16 DIAGNOSIS — Z1231 Encounter for screening mammogram for malignant neoplasm of breast: Secondary | ICD-10-CM

## 2021-10-16 DIAGNOSIS — E663 Overweight: Secondary | ICD-10-CM

## 2021-10-16 DIAGNOSIS — K7689 Other specified diseases of liver: Secondary | ICD-10-CM

## 2021-10-16 DIAGNOSIS — Z13220 Encounter for screening for lipoid disorders: Secondary | ICD-10-CM

## 2021-10-16 DIAGNOSIS — Z8639 Personal history of other endocrine, nutritional and metabolic disease: Secondary | ICD-10-CM

## 2021-10-16 DIAGNOSIS — I7090 Unspecified atherosclerosis: Secondary | ICD-10-CM

## 2021-10-18 ENCOUNTER — Other Ambulatory Visit: Payer: Self-pay | Admitting: Nurse Practitioner

## 2021-10-18 DIAGNOSIS — R928 Other abnormal and inconclusive findings on diagnostic imaging of breast: Secondary | ICD-10-CM

## 2021-10-23 ENCOUNTER — Ambulatory Visit
Admission: RE | Admit: 2021-10-23 | Discharge: 2021-10-23 | Disposition: A | Discharge status: Home / Self Care | Payer: BC Managed Care – PPO | Source: Ambulatory Visit | Attending: Nurse Practitioner | Admitting: Nurse Practitioner

## 2021-10-23 DIAGNOSIS — R928 Other abnormal and inconclusive findings on diagnostic imaging of breast: Secondary | ICD-10-CM

## 2021-11-10 ENCOUNTER — Ambulatory Visit (INDEPENDENT_AMBULATORY_CARE_PROVIDER_SITE_OTHER): Payer: BC Managed Care – PPO | Admitting: Nurse Practitioner

## 2021-11-10 ENCOUNTER — Encounter: Payer: Self-pay | Admitting: Nurse Practitioner

## 2021-11-10 VITALS — BP 128/74 | HR 63 | Temp 97.8°F | Ht 69.61 in | Wt 191.0 lb

## 2021-11-10 DIAGNOSIS — R933 Abnormal findings on diagnostic imaging of other parts of digestive tract: Secondary | ICD-10-CM | POA: Diagnosis not present

## 2021-11-10 DIAGNOSIS — Z5320 Procedure and treatment not carried out because of patient's decision for unspecified reasons: Secondary | ICD-10-CM

## 2021-11-10 DIAGNOSIS — Z1159 Encounter for screening for other viral diseases: Secondary | ICD-10-CM | POA: Insufficient documentation

## 2021-11-10 DIAGNOSIS — Z0001 Encounter for general adult medical examination with abnormal findings: Secondary | ICD-10-CM | POA: Diagnosis not present

## 2021-11-10 DIAGNOSIS — Z1283 Encounter for screening for malignant neoplasm of skin: Secondary | ICD-10-CM | POA: Diagnosis not present

## 2021-11-10 DIAGNOSIS — F40243 Fear of flying: Secondary | ICD-10-CM | POA: Insufficient documentation

## 2021-11-10 MED ORDER — LORAZEPAM 0.5 MG PO TABS: 0.5000 mg | ORAL_TABLET | Freq: Every day | ORAL | 0 refills | Status: DC | PRN

## 2021-11-10 NOTE — Assessment & Plan Note (Signed)
Overall exam grossly normal.  Patient up-to-date with mammogram.  She will be referred to gastroenterology to discuss screening colonoscopy.  She will have hepatitis C screening completed today.  We did discuss Shingrix vaccine, she will discuss this with her pharmacist to see how much this may cost her to pharmacy.  May consider administration in the office pending discussion with pharmacist.  She was encouraged to continue focusing on healthy lifestyle.

## 2021-11-10 NOTE — Assessment & Plan Note (Signed)
Controlled substance database reviewed today.  Will order Ativan 0.5 mg by mouth daily as needed for anxiety so she can use this when traveling on airplane.

## 2021-11-10 NOTE — Assessment & Plan Note (Signed)
Referral to gastroenterology made today for second opinion regarding when she should undergo routine screening colonoscopy again.

## 2021-11-10 NOTE — Assessment & Plan Note (Signed)
Referral to dermatology made today for routine skin cancer screening.

## 2021-11-10 NOTE — Patient Instructions (Signed)
Ask Pharmacy about [facility].

## 2021-11-10 NOTE — Progress Notes (Signed)
Established Patient Office Visit/annual exam  Subjective   Patient ID: Brianna Morgan, female    DOB: 10/04/1960  Age: 61 y.o. MRN: [id number]  Chief Complaint  Patient presents with   Annual Exam    Patient arrives today for the above.  She had blood work collected at last office visit which showed slightly reduced kidney function with GFR of 82, LDL elevated 123 (HDL 55, triglycerides 69, total cholesterol 192), no anemia, A1c in the prediabetic range at 5.8.  She has undergone breast cancer screening via mammography last month.  She is status post hysterectomy and was told she no longer needs Pap smears.  She had colonoscopy completed 01/2021 and was recommended to repeat in 3 years. That colonoscopy found evidence of a sessile submucosal mass involving 50% of the mucosal surface of her sigmoid colon.  Repeat evaluation and testing did not to determine or find any further abnormalities.  Patient would like to have second opinion with gastroenterology to determine whether or not she should wait 3 years for repeat colonoscopy or if she needs one sooner.  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  She is also requesting referral to dermatology for routine skin cancer screening.    Review of Systems  Respiratory:  Negative for shortness of breath.   Cardiovascular:  Negative for chest pain.  Gastrointestinal:  Negative for blood in stool.     Objective:     BP 128/74 (BP Location: Right Arm, Patient Position: Sitting, Cuff Size: Normal)   Pulse 63   Temp 97.8 F (36.6 C) (Oral)   Ht 5' 9.61" (1.768 m)   Wt 191 lb (86.6 kg)   SpO2 97%   BMI 27.71 kg/m  BP Readings from Last 3 Encounters:  11/10/21 128/74  10/05/21 124/72  09/04/21 (!) 143/78   Wt Readings from Last 3 Encounters:  11/10/21 191 lb (86.6 kg)  10/05/21 185 lb (83.9 kg)  08/28/21 182 lb 15.7 oz (83 kg)      Physical  Exam Vitals reviewed.  Constitutional:      Appearance: Normal appearance.  HENT:     Head: Normocephalic and atraumatic.     Right Ear: Tympanic membrane, ear canal and external ear normal.     Left Ear: Tympanic membrane, ear canal and external ear normal.  Eyes:     General:        Right eye: No discharge.        Left eye: No discharge.     Extraocular Movements: Extraocular movements intact.     Conjunctiva/sclera: Conjunctivae normal.     Pupils: Pupils are equal, round, and reactive to light.  Neck:     Vascular: No carotid bruit.  Cardiovascular:     Rate and Rhythm: Normal rate and regular rhythm.     Pulses: Normal pulses.     Heart sounds: Normal heart sounds. No murmur heard. Pulmonary:     Effort: Pulmonary effort is normal.     Breath sounds: Normal breath sounds.  Chest:  Breasts:    Breasts are symmetrical.     Right: Normal.     Left: Normal.  Abdominal:     General: Abdomen is flat. Bowel sounds are normal. There is no distension.     Palpations: Abdomen is soft. There is no mass.     Tenderness: There is no abdominal tenderness.  Musculoskeletal:  General: No tenderness.     Cervical back: Neck supple. No muscular tenderness.     Right lower leg: No edema.     Left lower leg: No edema.  Lymphadenopathy:     Cervical: No cervical adenopathy.     Upper Body:     Right upper body: No supraclavicular adenopathy.     Left upper body: No supraclavicular adenopathy.  Skin:    General: Skin is warm and dry.  Neurological:     General: No focal deficit present.     Mental Status: She is alert and oriented to person, place, and time.     Motor: No weakness.     Gait: Gait normal.  Psychiatric:        Mood and Affect: Mood normal.        Behavior: Behavior normal.        Judgment: Judgment normal.     No results found for any visits on 11/10/21.    The 10-year ASCVD risk score (Arnett DK, et al., 2019) is: 4.4%    Assessment & Plan:      Problem List Items Addressed This Visit       Other   Abnormal colonoscopy    Referral to gastroenterology made today for second opinion regarding when she should undergo routine screening colonoscopy again.       Relevant Orders   Ambulatory referral to Gastroenterology   Encounter for hepatitis C screening test for low risk patient    Hep C antibody ordered, further recommendations made based upon these results.       Skin cancer screening    Referral to dermatology made today for routine skin cancer screening.       Relevant Orders   Ambulatory referral to Dermatology   Anxiety with flying    Controlled substance database reviewed today.  Will order Ativan 0.5 mg by mouth daily as needed for anxiety so she can use this when traveling on airplane.       Relevant Medications   LORazepam (ATIVAN) 0.5 MG tablet   Encounter for general adult medical examination with abnormal findings - Primary    Overall exam grossly normal.  Patient up-to-date with mammogram.  She will be referred to gastroenterology to discuss screening colonoscopy.  She will have hepatitis C screening completed today.  We did discuss Shingrix vaccine, she will discuss this with her pharmacist to see how much this may cost her to pharmacy.  May consider administration in the office pending discussion with pharmacist.  She was encouraged to continue focusing on healthy lifestyle.        Return for 3-6 months for follow-up with Brianna Morgan; annual exam in year.  In addition to doing annual physical exam I also performed an office visit as documented above.   [name], NP

## 2021-11-10 NOTE — Assessment & Plan Note (Signed)
Hep C antibody ordered, further recommendations made based upon these results.

## 2021-11-13 LAB — HEPATITIS C ANTIBODY
Hepatitis C Ab: NONREACTIVE
SIGNAL TO CUT-OFF: 0.06 (ref <1.00)

## 2021-11-16 ENCOUNTER — Encounter: Payer: Self-pay | Admitting: Nurse Practitioner

## 2021-11-28 ENCOUNTER — Telehealth: Payer: Self-pay | Admitting: Gastroenterology

## 2021-11-28 NOTE — Telephone Encounter (Signed)
Hi Dr. Orvan Falconer,   We received a referral for patient to have a colonoscopy done it looks like she has GI history with [facility] in Epic for you to review.  Please review and advise on scheduling.   Thank you

## 2021-12-04 NOTE — Telephone Encounter (Signed)
Hi Dr. Orvan Falconer,  This patients husband was just accepted to the practice by Dr. Russella Dar, would you still deny her request?

## 2021-12-13 NOTE — Telephone Encounter (Signed)
Patient called to follow up on request. Please advise.

## 2021-12-27 NOTE — Telephone Encounter (Signed)
Spoke with patient and she states the request is really for a second opinion due to her recent colonoscopy abnormal results.  Please advise thanks

## 2022-01-03 NOTE — Telephone Encounter (Signed)
Hi Dr. Russella Dar,   Patient is wanting to ask if you would take her on as a patient and accept the request below, since you recently accepted her husband.  Thanks

## 2022-01-03 NOTE — Telephone Encounter (Signed)
Request received to transfer GI care from outside practice to [facility].  We appreciate the interest in our practice however, due to capacity and high demand from patients without established GI providers we cannot accommodate this transfer.  Agree with Dr. Orvan Falconer to seek an opinion at an academic medical center.

## 2022-01-04 NOTE — Telephone Encounter (Signed)
Called patient to advise left voicemail. °

## 2022-01-05 NOTE — Telephone Encounter (Signed)
Inbound call from patient. Further advised that we cannot accommodate her at this time. Patient advised understanding.  Thank you

## 2022-01-15 NOTE — Telephone Encounter (Signed)
Hi Dr. Russella Dar,  Patient called states she just recently saw you when you did her husband's colonoscopy and she would like to request a transfer of care again to permanently continue her care with you at [facility] and no longer wishes a second opinion. She expressed how much she wants to be your patient along with her husband.   Thank you

## 2022-01-15 NOTE — Telephone Encounter (Signed)
Please refer to prior messages from me and KB regarding transfer request stating the same. Request received to transfer GI care from outside practice to [facility].  We appreciate the interest in our practice, however at this time due to high demand from patients without established GI providers we cannot accommodate this transfer.

## 2022-01-19 ENCOUNTER — Encounter: Payer: Self-pay | Admitting: Nurse Practitioner

## 2022-01-19 ENCOUNTER — Ambulatory Visit: Payer: BC Managed Care – PPO | Admitting: Nurse Practitioner

## 2022-01-19 VITALS — BP 118/66 | HR 63 | Temp 98.2°F | Ht 69.0 in | Wt 186.0 lb

## 2022-01-19 DIAGNOSIS — R933 Abnormal findings on diagnostic imaging of other parts of digestive tract: Secondary | ICD-10-CM | POA: Diagnosis not present

## 2022-01-19 DIAGNOSIS — R21 Rash and other nonspecific skin eruption: Secondary | ICD-10-CM | POA: Diagnosis not present

## 2022-01-19 DIAGNOSIS — R519 Headache, unspecified: Secondary | ICD-10-CM

## 2022-01-19 DIAGNOSIS — Z1283 Encounter for screening for malignant neoplasm of skin: Secondary | ICD-10-CM

## 2022-01-19 DIAGNOSIS — R12 Heartburn: Secondary | ICD-10-CM | POA: Diagnosis not present

## 2022-01-19 LAB — COMPREHENSIVE METABOLIC PANEL
ALT: 21 U/L (ref 0–35)
AST: 17 U/L (ref 0–37)
Albumin: 4.3 g/dL (ref 3.5–5.2)
Alkaline Phosphatase: 66 U/L (ref 39–117)
BUN: 17 mg/dL (ref 6–23)
CO2: 29 mEq/L (ref 19–32)
Calcium: 10.3 mg/dL (ref 8.4–10.5)
Chloride: 105 mEq/L (ref 96–112)
Creatinine, Ser: 0.77 mg/dL (ref 0.40–1.20)
GFR: 82.07 mL/min (ref >60.00)
Glucose, Bld: 111 mg/dL — ABNORMAL HIGH (ref 70–99)
Potassium: 3.7 mEq/L (ref 3.5–5.1)
Sodium: 139 mEq/L (ref 135–145)
Total Bilirubin: 0.7 mg/dL (ref 0.2–1.2)
Total Protein: 6.9 g/dL (ref 6.0–8.3)

## 2022-01-19 MED ORDER — TRIAMCINOLONE ACETONIDE 0.1 % EX CREA: 1.0000 | TOPICAL_CREAM | Freq: Two times a day (BID) | CUTANEOUS | 0 refills | Status: DC

## 2022-01-19 NOTE — Progress Notes (Signed)
Established Patient Office Visit  Subjective   Patient ID: Brianna Morgan, female    DOB: 09-Jun-1961  Age: 61 y.o. MRN: [id number]  Chief Complaint  Patient presents with   84mo follow up    Discuss referrals   possible poison ivy    Abdomen and right arm   Headache    Patient arrives today for follow-up.  Patient concerns discussed as below.  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  She was told to follow-up and have repeat colonoscopy in August 2025, she did want to discuss this with gastroenterologist in this area to determine if she needs colonoscopy sooner.  She is referred to [facility], but they recommend she follow-up with an medical educational institution.  She reports she plans to reach out to [facility] or [facility].  Headache: Approximately 10 days ago patient experienced a right-sided headache that was mild in intensity but was persistent.  She reports some similar migraine-like signs such as unilateral pain however it was not aborted by Excedrin which usually will abort her headaches.  She reports that within 1.5 weeks the headache resolved spontaneously.  She denies any visual changes, dizziness, sensory changes, or weakness associated with the headache.  She also denies any seizure.  Heartburn: Has reported some intermittent " acidic stomach" symptoms.  She has noticed that sugars will be high fatty meats such as bacon or sausages.  She also has noticed symptoms are worse at night.  She will take Tums as needed which relieves the symptoms.  She reports taking Tums approximately twice a week.  She is concerned her gallbladder may be involved.  Rash: Has a rash located to her right arm and abdomen.  This occurred shortly after working outside in her yard.  Also occurred after wearing a new bracelet on her right arm.  Rash was itchy and did weep fluid, however these  have mostly resolved.  She reports rash is now drying out and seems to be clearing.  She denies any burning or stinging sensation.    Review of Systems  Eyes:  Negative for blurred vision, double vision and pain.  Respiratory:  Negative for cough and shortness of breath.   Cardiovascular:  Negative for chest pain.  Gastrointestinal:  Positive for heartburn. Negative for abdominal pain, blood in stool, melena, nausea and vomiting.  Neurological:  Positive for headaches. Negative for dizziness, sensory change and weakness.      Objective:     BP 118/66 (BP Location: Left Arm, Patient Position: Sitting, Cuff Size: Large)   Pulse 63   Temp 98.2 F (36.8 C) (Oral)   Ht 5\' 9"  (1.753 m)   Wt 186 lb (84.4 kg)   SpO2 96%   BMI 27.47 kg/m    Physical Exam Vitals reviewed.  Constitutional:      General: She is not in acute distress.    Appearance: Normal appearance.  HENT:     Head: Normocephalic and atraumatic.  Neck:     Vascular: No carotid bruit.  Cardiovascular:     Rate and Rhythm: Normal rate and regular rhythm.     Pulses: Normal pulses.     Heart sounds: Normal heart sounds.  Pulmonary:     Effort: Pulmonary effort is normal.     Breath sounds: Normal breath sounds.  Abdominal:     General: Abdomen is flat. Bowel sounds are normal. There is  no distension.     Palpations: Abdomen is soft.     Tenderness: There is no abdominal tenderness.  Skin:    General: Skin is warm and dry.       Neurological:     General: No focal deficit present.     Mental Status: She is alert and oriented to person, place, and time.  Psychiatric:        Mood and Affect: Mood normal.        Behavior: Behavior normal.        Judgment: Judgment normal.      No results found for any visits on 01/19/22.    The 10-year ASCVD risk score (Arnett DK, et al., 2019) is: 3.8%    Assessment & Plan:   Problem List Items Addressed This Visit       Musculoskeletal and Integument   Rash     Acute, improving.  Because she still has some itching will prescribe triamcinolone she can apply twice a day as needed for up to 2 weeks.  Most likely dermatitis from exposure to poison ivy.  Patient encouraged to watch for signs of secondary bacterial infection such as swelling, redness, thick yellow/green drainage, pain, heat, and/or fever.  She is encouraged to let me know if these occur.  She is also encouraged to consider using Neosporin as needed over the area to prevent secondary infection.  She reports understanding.      Relevant Medications   triamcinolone cream (KENALOG) 0.1 %     Other   Abnormal colonoscopy    Patient encouraged to follow-up with [facility] and/or [facility], she was encouraged let me know if she needs me to order additional referral.      Skin cancer screening - Primary    Patient did not hear from dermatology office that she was referred to previously.  Will reorder referral today, patient told to let me know if she does not hear from dermatologist within 7 to 10 days.  She reports understanding.      Relevant Orders   Ambulatory referral to Dermatology   Heartburn    Gastric symptoms do seem consistent with heartburn or early GERD.  Using as needed Tums seems to be controlling symptoms well at this time.  For now she will continue using Tums, but will let me know if symptoms persist or worsen.  She was also encouraged to focus on avoiding triggering foods such as acidic foods, mint, caffeine, chocolate, spicy foods.  She reports understanding.      Relevant Orders   Comprehensive metabolic panel   RESOLVED: Nonintractable headache    Resolved, no red flag signs noted.  Recommend patient let me know if symptoms recur.  She is specifically told to watch out for seizure, visual changes, severe/acute pain.  She reports understanding.       Return in about 3 months (around 04/21/2022) for F/U with Alexius Hangartner.    [name], NP

## 2022-01-19 NOTE — Assessment & Plan Note (Signed)
Acute, improving.  Because she still has some itching will prescribe triamcinolone she can apply twice a day as needed for up to 2 weeks.  Most likely dermatitis from exposure to poison ivy.  Patient encouraged to watch for signs of secondary bacterial infection such as swelling, redness, thick yellow/green drainage, pain, heat, and/or fever.  She is encouraged to let me know if these occur.  She is also encouraged to consider using Neosporin as needed over the area to prevent secondary infection.  She reports understanding.

## 2022-01-19 NOTE — Assessment & Plan Note (Signed)
Resolved, no red flag signs noted.  Recommend patient let me know if symptoms recur.  She is specifically told to watch out for seizure, visual changes, severe/acute pain.  She reports understanding.

## 2022-01-19 NOTE — Assessment & Plan Note (Signed)
Gastric symptoms do seem consistent with heartburn or early GERD.  Using as needed Tums seems to be controlling symptoms well at this time.  For now she will continue using Tums, but will let me know if symptoms persist or worsen.  She was also encouraged to focus on avoiding triggering foods such as acidic foods, mint, caffeine, chocolate, spicy foods.  She reports understanding.

## 2022-01-19 NOTE — Assessment & Plan Note (Signed)
Patient encouraged to follow-up with [facility] and/or [facility], she was encouraged let me know if she needs me to order additional referral.

## 2022-01-19 NOTE — Assessment & Plan Note (Signed)
Patient did not hear from dermatology office that she was referred to previously.  Will reorder referral today, patient told to let me know if she does not hear from dermatologist within 7 to 10 days.  She reports understanding.

## 2022-01-19 NOTE — Patient Instructions (Signed)
Send me a message if you have not heard from dermatology within 10 days.  Take tums as needed, let me know if symptoms worsen  Notify me if headache returns

## 2022-01-23 ENCOUNTER — Other Ambulatory Visit: Payer: Self-pay | Admitting: Nurse Practitioner

## 2022-01-23 DIAGNOSIS — R933 Abnormal findings on diagnostic imaging of other parts of digestive tract: Secondary | ICD-10-CM

## 2022-01-23 NOTE — Progress Notes (Signed)
Referral to GI  

## 2022-01-26 ENCOUNTER — Telehealth: Payer: Self-pay | Admitting: Nurse Practitioner

## 2022-01-26 NOTE — Telephone Encounter (Signed)
Pt has referral to see Dr. Alvia Grove, MD. She said she contacted his office this morning to schedule an appointment and they advised her that they needed additional information from sarah gray. I asked pt what information is needed. She said "she thinks they need to know why she is needing to be seen there".   Sham said that we may call her with any questions at [phone]   Please advise

## 2022-01-26 NOTE — Telephone Encounter (Signed)
Please advise and let me know how I may assist. 

## 2022-02-13 ENCOUNTER — Telehealth: Payer: Self-pay | Admitting: Nurse Practitioner

## 2022-02-13 NOTE — Telephone Encounter (Signed)
Patient got turned down at [facility] - GI clinic - she will need another referral and would like to talk to someone about this.

## 2022-02-15 ENCOUNTER — Encounter: Payer: Self-pay | Admitting: Nurse Practitioner

## 2022-02-15 NOTE — Telephone Encounter (Signed)
Patient was wondering if we could ask [facility] to review her pathology results and colonoscopy results which are now in the media section of her chart to determine if they would be willing to accept her as a new patient.  This is her first preference as far as GI specialty in the area.

## 2022-02-16 ENCOUNTER — Telehealth: Payer: Self-pay | Admitting: Nurse Practitioner

## 2022-02-16 ENCOUNTER — Other Ambulatory Visit: Payer: Self-pay | Admitting: Nurse Practitioner

## 2022-02-16 DIAGNOSIS — R1013 Epigastric pain: Secondary | ICD-10-CM

## 2022-02-16 NOTE — Telephone Encounter (Signed)
[facility] is unable to see this patient for their referral

## 2022-02-22 NOTE — Telephone Encounter (Signed)
Spoke to pt, pt stated that wouldn't see her because she does not qualify for care, so pt reached out to [facility] Dr. Russella Dar for care, who confirm with her current medical issue that was present in the ED, will allow her for care. Pt waiting for a call to schedule

## 2022-04-27 ENCOUNTER — Ambulatory Visit: Payer: BC Managed Care – PPO | Admitting: Nurse Practitioner

## 2022-04-27 ENCOUNTER — Encounter: Payer: Self-pay | Admitting: Nurse Practitioner

## 2022-04-27 VITALS — BP 122/80 | HR 53 | Temp 98.7°F | Ht 69.0 in | Wt 188.0 lb

## 2022-04-27 DIAGNOSIS — R933 Abnormal findings on diagnostic imaging of other parts of digestive tract: Secondary | ICD-10-CM | POA: Diagnosis not present

## 2022-04-27 DIAGNOSIS — H9312 Tinnitus, left ear: Secondary | ICD-10-CM | POA: Diagnosis not present

## 2022-04-27 DIAGNOSIS — K219 Gastro-esophageal reflux disease without esophagitis: Secondary | ICD-10-CM

## 2022-04-27 DIAGNOSIS — R21 Rash and other nonspecific skin eruption: Secondary | ICD-10-CM | POA: Diagnosis not present

## 2022-04-27 NOTE — Assessment & Plan Note (Signed)
Chronic, currently controlled by diet.  Patient to follow-up with gastroenterology as scheduled.

## 2022-04-27 NOTE — Assessment & Plan Note (Signed)
Resolved, no further work-up recommended today.  Patient to follow-up with dermatology as scheduled for skin cancer screening.

## 2022-04-27 NOTE — Progress Notes (Signed)
Established Patient Office Visit  Subjective   Patient ID: Brianna Morgan, female    DOB: 1961-01-12  Age: 61 y.o. MRN: [id number]  Chief Complaint  Patient presents with   Tinnitus    Patient has today for follow-up.  Tinnitus/clicking sound: Started 1 month ago.  Occurs intermittently approximately 2-3 times per week.  At first thought she was hearing her watch, but then hurt it when she was not wearing her watch.  Her husband has heard this clicking noise as well when listening to her left ear.  No obvious triggers noted, clicking will subside on its own.  She feels the sensation is located on the left side of her head/ear.  She does have history of eczema in her left ear canal.  Not experiencing other neurologic symptoms or changes to hearing.  GERD/history of abnormal colonoscopy: Following with gastroenterology.  Currently managing GERD by reduction in coffee intake.  Feels that this is helped.  No abdominal symptoms noted today.  Rash: Treated with triamcinolone last office visit, has resolved.  Is scheduled to undergo skin cancer screening with dermatology early next year.    Review of Systems  HENT:  Negative for hearing loss, sinus pain and tinnitus.   Eyes:  Negative for blurred vision and double vision.  Respiratory:  Negative for shortness of breath.   Cardiovascular:  Negative for chest pain and palpitations.  Gastrointestinal:  Positive for heartburn.  Neurological:  Positive for headaches. Negative for dizziness.      Objective:     BP 122/80 (BP Location: Left Arm, Patient Position: Sitting, Cuff Size: Large)   Pulse (!) 53   Temp 98.7 F (37.1 C) (Oral)   Ht 5\' 9"  (1.753 m)   Wt 188 lb (85.3 kg)   SpO2 97%   BMI 27.76 kg/m  BP Readings from Last 3 Encounters:  04/27/22 122/80  01/19/22 118/66  11/10/21 128/74   Wt Readings from Last 3 Encounters:  04/27/22 188 lb (85.3 kg)  01/19/22 186 lb (84.4 kg)  11/10/21 191 lb (86.6 kg)      Physical  Exam Vitals reviewed.  Constitutional:      General: She is not in acute distress.    Appearance: Normal appearance.  HENT:     Head: Normocephalic and atraumatic.     Right Ear: Hearing, tympanic membrane and ear canal normal.     Left Ear: Hearing normal. Swelling present.     Ears:     Comments: Cerumen and inflammation in left ear canal noted, patient has history of eczema in left ear Neck:     Vascular: No carotid bruit.  Cardiovascular:     Rate and Rhythm: Normal rate and regular rhythm.     Pulses: Normal pulses.     Heart sounds: Normal heart sounds.  Pulmonary:     Effort: Pulmonary effort is normal.     Breath sounds: Normal breath sounds.  Skin:    General: Skin is warm and dry.  Neurological:     General: No focal deficit present.     Mental Status: She is alert and oriented to person, place, and time.  Psychiatric:        Mood and Affect: Mood normal.        Behavior: Behavior normal.        Judgment: Judgment normal.      No results found for any visits on 04/27/22.    The 10-year ASCVD risk score (Arnett DK, et al.,  2019) is: 4.1%    Assessment & Plan:   Problem List Items Addressed This Visit       Digestive   GERD (gastroesophageal reflux disease)    Chronic, currently controlled by diet.  Patient to follow-up with gastroenterology as scheduled.        Musculoskeletal and Integument   RESOLVED: Rash    Resolved, no further work-up recommended today.  Patient to follow-up with dermatology as scheduled for skin cancer screening.        Other   Abnormal colonoscopy    Patient to follow-up with gastroenterology as scheduled and to undergo repeat colonoscopy sometime next year.      Tinnitus of left ear - Primary    Etiology unclear, she describes more of a clicking as opposed to a ringing and this is perceived by her husband as well as herself.  Anatomy of left ear does show swelling and what appears to be dry scaling skin in the ear canal.   Not sure if this is related to her symptoms.  Will refer to ENT for further evaluation.      Relevant Orders   Ambulatory referral to ENT    Return in about 5 months (around 09/26/2022) for CPE with Maralyn Sago in April.    [name], NP

## 2022-04-27 NOTE — Assessment & Plan Note (Signed)
Etiology unclear, she describes more of a clicking as opposed to a ringing and this is perceived by her husband as well as herself.  Anatomy of left ear does show swelling and what appears to be dry scaling skin in the ear canal.  Not sure if this is related to her symptoms.  Will refer to ENT for further evaluation.

## 2022-04-27 NOTE — Assessment & Plan Note (Signed)
Patient to follow-up with gastroenterology as scheduled and to undergo repeat colonoscopy sometime next year.

## 2022-05-08 ENCOUNTER — Encounter: Payer: Self-pay | Admitting: Nurse Practitioner

## 2022-05-10 NOTE — Addendum Note (Signed)
Addended by: [name] on: 05/10/2022 05:25 PM   Modules accepted: Orders

## 2022-06-01 ENCOUNTER — Encounter: Payer: Self-pay | Admitting: Nurse Practitioner

## 2022-06-11 HISTORY — PX: COLONOSCOPY: SHX174

## 2022-06-11 HISTORY — PX: ESOPHAGOGASTRODUODENOSCOPY ENDOSCOPY: SHX5814

## 2022-09-14 ENCOUNTER — Ambulatory Visit: Payer: BC Managed Care – PPO | Admitting: Nurse Practitioner

## 2022-09-20 ENCOUNTER — Other Ambulatory Visit: Payer: Self-pay | Admitting: Nurse Practitioner

## 2022-09-20 DIAGNOSIS — Z1231 Encounter for screening mammogram for malignant neoplasm of breast: Secondary | ICD-10-CM

## 2022-09-21 ENCOUNTER — Ambulatory Visit (INDEPENDENT_AMBULATORY_CARE_PROVIDER_SITE_OTHER): Payer: Commercial Managed Care - PPO | Admitting: Nurse Practitioner

## 2022-09-21 VITALS — BP 108/74 | HR 69 | Temp 98.1°F | Ht 69.0 in | Wt 185.4 lb

## 2022-09-21 DIAGNOSIS — E785 Hyperlipidemia, unspecified: Secondary | ICD-10-CM

## 2022-09-21 DIAGNOSIS — A6000 Herpesviral infection of urogenital system, unspecified: Secondary | ICD-10-CM

## 2022-09-21 DIAGNOSIS — H9312 Tinnitus, left ear: Secondary | ICD-10-CM

## 2022-09-21 DIAGNOSIS — E663 Overweight: Secondary | ICD-10-CM

## 2022-09-21 DIAGNOSIS — Z131 Encounter for screening for diabetes mellitus: Secondary | ICD-10-CM | POA: Diagnosis not present

## 2022-09-21 LAB — CBC
HCT: 38.3 % (ref 36.0–46.0)
Hemoglobin: 13 g/dL (ref 12.0–15.0)
MCHC: 34 g/dL (ref 30.0–36.0)
MCV: 89.1 fl (ref 78.0–100.0)
Platelets: 231 10*3/uL (ref 150.0–400.0)
RBC: 4.3 Mil/uL (ref 3.87–5.11)
RDW: 13.2 % (ref 11.5–15.5)
WBC: 7.3 10*3/uL (ref 4.0–10.5)

## 2022-09-21 LAB — COMPREHENSIVE METABOLIC PANEL
ALT: 27 U/L (ref 0–35)
AST: 18 U/L (ref 0–37)
Albumin: 4.3 g/dL (ref 3.5–5.2)
Alkaline Phosphatase: 66 U/L (ref 39–117)
BUN: 12 mg/dL (ref 6–23)
CO2: 30 mEq/L (ref 19–32)
Calcium: 10.2 mg/dL (ref 8.4–10.5)
Chloride: 103 mEq/L (ref 96–112)
Creatinine, Ser: 0.89 mg/dL (ref 0.40–1.20)
GFR: 68.65 mL/min (ref >60.00)
Glucose, Bld: 116 mg/dL — ABNORMAL HIGH (ref 70–99)
Potassium: 3.7 mEq/L (ref 3.5–5.1)
Sodium: 139 mEq/L (ref 135–145)
Total Bilirubin: 0.7 mg/dL (ref 0.2–1.2)
Total Protein: 6.8 g/dL (ref 6.0–8.3)

## 2022-09-21 LAB — LIPID PANEL
Cholesterol: 217 mg/dL — ABNORMAL HIGH (ref 0–200)
HDL: 58 mg/dL (ref >39.00)
NonHDL: 159.32
Total CHOL/HDL Ratio: 4
Triglycerides: 217 mg/dL — ABNORMAL HIGH (ref 0.0–149.0)
VLDL: 43.4 mg/dL — ABNORMAL HIGH (ref 0.0–40.0)

## 2022-09-21 LAB — HEMOGLOBIN A1C: Hgb A1c MFr Bld: 5.9 % (ref 4.6–6.5)

## 2022-09-21 LAB — LDL CHOLESTEROL, DIRECT: Direct LDL: 144 mg/dL

## 2022-09-21 LAB — TSH: TSH: 2.67 u[IU]/mL (ref 0.35–5.50)

## 2022-09-21 MED ORDER — ACYCLOVIR 800 MG PO TABS: ORAL_TABLET | ORAL | 3 refills | Status: DC

## 2022-09-21 NOTE — Progress Notes (Signed)
Established Patient Office Visit  Subjective   Patient ID: Brianna Morgan, female    DOB: 04-06-1961  Age: 62 y.o. MRN: [id number]  Chief Complaint  Patient presents with   Medical Management of Chronic Issues    Follow up/ medication refills     Tinnitus/clicking: Was evaluated by ENT, dentist, cardiac provider no etiology has yet to be identified.  Patient does have a video of the cooking for me to view today.  Has not experienced this sensation in approximately 2 months.  Remains asymptomatic other than the clicking.  Blood in stool: Has not noticed any blood in stool since December 2023.  Is seeing gastroenterology and plans to undergo endoscopy and sigmoidoscopy in May.  Continues on Prilosec for treatment of GERD and as needed Tums.  Symptoms well-controlled on current regimen.  Genital herpes: Has intermittent outbreaks.  Takes Sickler as needed, requesting refill today.  Health maintenance: Mammogram 5/23, has this years mammogram already scheduled.  Colonoscopy: 02/03/2021.  Tetanus vaccine: 08/30/2021.  Hep C screening: Completed.    Review of Systems  Constitutional:  Negative for fever and weight loss.  Respiratory:  Negative for cough and shortness of breath.   Cardiovascular:  Negative for chest pain.  Gastrointestinal:  Positive for heartburn. Negative for abdominal pain and blood in stool.  Neurological:  Negative for seizures and loss of consciousness.  Psychiatric/Behavioral:  Negative for depression and suicidal ideas. The patient is not nervous/anxious.       Objective:     BP 108/74   Pulse 69   Temp 98.1 F (36.7 C) (Temporal)   Ht  (1.753 m)   Wt 185 lb 6 oz (84.1 kg)   SpO2 96%   BMI 27.38 kg/m  BP Readings from Last 3 Encounters:  09/21/22 108/74  04/27/22 122/80  01/19/22 118/66   Wt Readings from Last 3 Encounters:  09/21/22 185 lb 6 oz (84.1 kg)  04/27/22 188 lb (85.3 kg)  01/19/22 186 lb (84.4 kg)        09/21/2022    1:11 PM  01/19/2022   10:35 AM 11/10/2021    9:45 AM  PHQ9 SCORE ONLY  PHQ-9 Total Score 0 0 0     Physical Exam Vitals reviewed.  Constitutional:      General: She is not in acute distress.    Appearance: Normal appearance.  HENT:     Head: Normocephalic and atraumatic.  Neck:     Vascular: No carotid bruit.  Cardiovascular:     Rate and Rhythm: Normal rate and regular rhythm.     Pulses: Normal pulses.     Heart sounds: Normal heart sounds.  Pulmonary:     Effort: Pulmonary effort is normal.     Breath sounds: Normal breath sounds.  Skin:    General: Skin is warm and dry.  Neurological:     General: No focal deficit present.     Mental Status: She is alert and oriented to person, place, and time.  Psychiatric:        Mood and Affect: Mood normal.        Behavior: Behavior normal.        Judgment: Judgment normal.      No results found for any visits on 09/21/22.    The 10-year ASCVD risk score (Arnett DK, et al., 2019) is: 3.6%    Assessment & Plan:   Problem List Items Addressed This Visit       Genitourinary  Genital herpes simplex - Primary    Intermittent, requesting refill of acyclovir for episodic tx. Acyclovir 800mg  BID x 5 days as needed sent to pharmacy.       Relevant Medications   acyclovir (ZOVIRAX) 800 MG tablet     Other   Tinnitus of left ear    Symptoms seem to have resolved as of right now.  We did discuss possibly pursuing imaging of the head for further evaluation, for now patient would prefer to defer any additional workup unless clicking returns.      Diabetes mellitus screening    Labs ordered, Further recommendations may be made based on these results.        Relevant Orders   Hemoglobin A1c   Overweight    Labs ordered, Further recommendations may be made based on these results.       Relevant Orders   CBC   Lipid panel   TSH   Hyperlipidemia    Labs ordered, Further recommendations may be made based on these results.        Relevant Orders   CBC   Comprehensive metabolic panel   Lipid panel    Return in about 6 months (around 03/23/2023) for F/U with Hosey Burmester.    [name], NP

## 2022-09-21 NOTE — Assessment & Plan Note (Signed)
Labs ordered, Further recommendations may be made based on these results.   

## 2022-09-21 NOTE — Assessment & Plan Note (Signed)
Intermittent, requesting refill of acyclovir for episodic tx. Acyclovir 800mg  BID x 5 days as needed sent to pharmacy.

## 2022-09-21 NOTE — Assessment & Plan Note (Signed)
Symptoms seem to have resolved as of right now.  We did discuss possibly pursuing imaging of the head for further evaluation, for now patient would prefer to defer any additional workup unless clicking returns.

## 2022-09-26 ENCOUNTER — Encounter: Payer: Self-pay | Admitting: Nurse Practitioner

## 2022-09-26 NOTE — Telephone Encounter (Signed)
Requesting pending for provider approval

## 2022-09-27 ENCOUNTER — Other Ambulatory Visit: Payer: Self-pay | Admitting: Nurse Practitioner

## 2022-09-27 DIAGNOSIS — R944 Abnormal results of kidney function studies: Secondary | ICD-10-CM

## 2022-09-28 ENCOUNTER — Other Ambulatory Visit: Payer: Self-pay | Admitting: Nurse Practitioner

## 2022-09-28 DIAGNOSIS — E785 Hyperlipidemia, unspecified: Secondary | ICD-10-CM

## 2022-10-04 ENCOUNTER — Other Ambulatory Visit (INDEPENDENT_AMBULATORY_CARE_PROVIDER_SITE_OTHER): Payer: Commercial Managed Care - PPO

## 2022-10-04 DIAGNOSIS — E785 Hyperlipidemia, unspecified: Secondary | ICD-10-CM

## 2022-10-04 DIAGNOSIS — R944 Abnormal results of kidney function studies: Secondary | ICD-10-CM | POA: Diagnosis not present

## 2022-10-04 LAB — BASIC METABOLIC PANEL
BUN: 16 mg/dL (ref 6–23)
CO2: 29 mEq/L (ref 19–32)
Calcium: 10 mg/dL (ref 8.4–10.5)
Chloride: 103 mEq/L (ref 96–112)
Creatinine, Ser: 0.78 mg/dL (ref 0.40–1.20)
GFR: 80.41 mL/min (ref >60.00)
Glucose, Bld: 100 mg/dL — ABNORMAL HIGH (ref 70–99)
Potassium: 4.1 mEq/L (ref 3.5–5.1)
Sodium: 138 mEq/L (ref 135–145)

## 2022-10-04 LAB — MICROALBUMIN / CREATININE URINE RATIO
Creatinine,U: 95.5 mg/dL
Microalb Creat Ratio: 0.8 mg/g (ref 0.0–30.0)
Microalb, Ur: 0.8 mg/dL (ref 0.0–1.9)

## 2022-10-04 LAB — LIPID PANEL
Cholesterol: 215 mg/dL — ABNORMAL HIGH (ref 0–200)
HDL: 51.8 mg/dL (ref >39.00)
LDL Cholesterol: 148 mg/dL — ABNORMAL HIGH (ref 0–99)
NonHDL: 162.99
Total CHOL/HDL Ratio: 4
Triglycerides: 73 mg/dL (ref 0.0–149.0)
VLDL: 14.6 mg/dL (ref 0.0–40.0)

## 2022-10-16 ENCOUNTER — Encounter: Payer: Self-pay | Admitting: Nurse Practitioner

## 2022-10-18 ENCOUNTER — Other Ambulatory Visit: Payer: Self-pay | Admitting: Nurse Practitioner

## 2022-10-18 DIAGNOSIS — R7303 Prediabetes: Secondary | ICD-10-CM

## 2022-10-26 ENCOUNTER — Ambulatory Visit
Admission: RE | Admit: 2022-10-26 | Discharge: 2022-10-26 | Disposition: A | Discharge status: Home / Self Care | Payer: 59 | Source: Ambulatory Visit | Attending: Nurse Practitioner | Admitting: Nurse Practitioner

## 2022-10-26 DIAGNOSIS — Z1231 Encounter for screening mammogram for malignant neoplasm of breast: Secondary | ICD-10-CM

## 2022-11-29 ENCOUNTER — Telehealth: Payer: Self-pay | Admitting: Nurse Practitioner

## 2022-11-29 DIAGNOSIS — F40243 Fear of flying: Secondary | ICD-10-CM

## 2022-11-29 MED ORDER — LORAZEPAM 0.5 MG PO TABS
0.5000 mg | ORAL_TABLET | Freq: Every day | ORAL | 0 refills | Status: DC | PRN
Start: 2022-11-29 — End: 2024-05-01

## 2022-11-29 NOTE — Telephone Encounter (Signed)
Prescription Request  11/29/2022  LOV: 09/21/2022  What is the name of the medication or equipment?  LORazepam (ATIVAN) 0.5 MG tablet   Have you contacted your pharmacy to request a refill? No   Which pharmacy would you like this sent to?  [id number] - [place], [place] - [address] [place] [address] Phone: [phone] Fax: [phone]    Patient notified that their request is being sent to the clinical staff for review and that they should receive a response within 2 business days.   Please advise at Mobile [phone] (mobile)

## 2023-01-17 ENCOUNTER — Ambulatory Visit: Payer: BC Managed Care – PPO | Admitting: Nurse Practitioner

## 2023-01-21 ENCOUNTER — Encounter: Payer: Self-pay | Admitting: "Endocrinology

## 2023-01-21 ENCOUNTER — Ambulatory Visit: Payer: 59 | Admitting: "Endocrinology

## 2023-01-21 VITALS — BP 120/78 | HR 72 | Ht 69.0 in | Wt 176.6 lb

## 2023-01-21 DIAGNOSIS — E782 Mixed hyperlipidemia: Secondary | ICD-10-CM | POA: Diagnosis not present

## 2023-01-21 DIAGNOSIS — R7303 Prediabetes: Secondary | ICD-10-CM | POA: Diagnosis not present

## 2023-01-21 LAB — POCT GLYCOSYLATED HEMOGLOBIN (HGB A1C): HbA1c, POC (prediabetic range): 5.5 % — AB (ref 5.7–6.4)

## 2023-01-21 NOTE — Patient Instructions (Signed)
                                     Advice for Weight Management  -For most of us the best way to lose weight is by diet management. Generally speaking, diet management means consuming less calories intentionally which over time brings about progressive weight loss.  This can be achieved more effectively by avoiding ultra processed carbohydrates, processed meats, unhealthy fats.    It is critically important to know your numbers: how much calorie you are consuming and how much calorie you need. More importantly, our carbohydrates sources should be unprocessed naturally occurring  complex starch food items.  It is always important to balance nutrition also by  appropriate intake of proteins (mainly plant-based), healthy fats/oils, plenty of fruits and vegetables.   -The American College of Lifestyle Medicine (ACL M) recommends nutrition derived mostly from Whole Food, Plant Predominant Sources example an apple instead of applesauce or apple pie. Eat Plenty of vegetables, Mushrooms, fruits, Legumes, Whole Grains, Nuts, seeds in lieu of processed meats, processed snacks/pastries red meat, poultry, eggs.  Use only water or unsweetened tea for hydration.  The College also recommends the need to stay away from risky substances including alcohol, smoking; obtaining 7-9 hours of restorative sleep, at least 150 minutes of moderate intensity exercise weekly, importance of healthy social connections, and being mindful of stress and seek help when it is overwhelming.    -Sticking to a routine mealtime to eat 3 meals a day and avoiding unnecessary snacks is shown to have a big role in weight control. Under normal circumstances, the only time we burn stored energy is when we are hungry, so allow  some hunger to take place- hunger means no food between appropriate meal times, only water.  It is not advisable to starve.   -It is better to avoid simple carbohydrates including:  Cakes, Sweet Desserts, Ice Cream, Soda (diet and regular), Sweet Tea, Candies, Chips, Cookies, Store Bought Juices, Alcohol in Excess of  1-2 drinks a day, Lemonade,  Artificial Sweeteners, Doughnuts, Coffee Creamers, "Sugar-free" Products, etc, etc.  This is not a complete list.....    -Consulting with certified diabetes educators is proven to provide you with the most accurate and current information on diet.  Also, you may be  interested in discussing diet options/exchanges , we can schedule a visit with Brianna Morgan, RDN, CDE for individualized nutrition education.  -Exercise: If you are able: 30 -60 minutes a day ,4 days a week, or 150 minutes of moderate intensity exercise weekly.    The longer the better if tolerated.  Combine stretch, strength, and aerobic activities.  If you were told in the past that you have high risk for cardiovascular diseases, or if you are currently symptomatic, you may seek evaluation by your heart doctor prior to initiating moderate to intense exercise programs.                                  Additional Care Considerations for Diabetes/Prediabetes   -Diabetes  is a chronic disease.  The most important care consideration is regular follow-up with your diabetes care provider with the goal being avoiding or delaying its complications and to take advantage of advances in medications and technology.  If appropriate actions are taken early enough, type 2 diabetes can even be   reversed.  Seek information from the right source.  - Whole Food, Plant Predominant Nutrition is highly recommended: Eat Plenty of vegetables, Mushrooms, fruits, Legumes, Whole Grains, Nuts, seeds in lieu of processed meats, processed snacks/pastries red meat, poultry, eggs as recommended by American College of  Lifestyle Medicine (ACLM).  -Type 2 diabetes is known to coexist with other important comorbidities such as high blood pressure and high cholesterol.  It is critical to control not only the  diabetes but also the high blood pressure and high cholesterol to minimize and delay the risk of complications including coronary artery disease, stroke, amputations, blindness, etc.  The good news is that this diet recommendation for type 2 diabetes is also very helpful for managing high cholesterol and high blood blood pressure.  - Studies showed that people with diabetes will benefit from a class of medications known as ACE inhibitors and statins.  Unless there are specific reasons not to be on these medications, the standard of care is to consider getting one from these groups of medications at an optimal doses.  These medications are generally considered safe and proven to help protect the heart and the kidneys.    - People with diabetes are encouraged to initiate and maintain regular follow-up with eye doctors, foot doctors, dentists , and if necessary heart and kidney doctors.     - It is highly recommended that people with diabetes quit smoking or stay away from smoking, and get yearly  flu vaccine and pneumonia vaccine at least every 5 years.  See above for additional recommendations on exercise, sleep, stress management , and healthy social connections.      

## 2023-01-21 NOTE — Progress Notes (Signed)
Endocrinology Consult Note       01/21/2023, 5:58 PM   Subjective:    Patient ID: Brianna Morgan, female    DOB: December 08, 1960.  Brianna Morgan is being seen in consultation for management of currently uncontrolled symptomatic diabetes requested by  [name], NP.   Past Medical History:  Diagnosis Date   Nonintractable headache 01/19/2022    Past Surgical History:  Procedure Laterality Date   ABDOMINAL HYSTERECTOMY     COLONOSCOPY  2024   ESOPHAGOGASTRODUODENOSCOPY ENDOSCOPY  2024   PARATHYROIDECTOMY     REPLACEMENT TOTAL KNEE Right 2020    Social History   Socioeconomic History   Marital status: Married    Spouse name: Not on file   Number of children: Not on file   Years of education: Not on file   Highest education level: Not on file  Occupational History   Not on file  Tobacco Use   Smoking status: Never   Smokeless tobacco: Never  Vaping Use   Vaping status: Never Used  Substance and Sexual Activity   Alcohol use: Yes    Comment: occ   Drug use: Never   Sexual activity: Not on file  Other Topics Concern   Not on file  Social History Narrative   Not on file   Social Determinants of Health   Financial Resource Strain: Not on file  Food Insecurity: Not on file  Transportation Needs: Not on file  Physical Activity: Not on file  Stress: Not on file  Social Connections: Not on file    Family History  Problem Relation Age of Onset   Cancer Mother    Thyroid disease Mother    Osteoporosis Mother    Hypertension Father    Hyperlipidemia Father    Heart attack Father    Breast cancer Neg Hx     Outpatient Encounter Medications as of 01/21/2023  Medication Sig   Calcium Carbonate Antacid (TUMS PO) Take 1-2 tablets by mouth as needed.   DHA-EPA-Vitamin E (OMEGA-3 COMPLEX PO) Take 2 capsules by mouth daily.   famotidine (PEPCID) 20 MG tablet Take 20 mg by mouth  daily.   OVER THE COUNTER MEDICATION Magnesium Lysinate Glycinate 2 tabs each morning and 2 tabs each evening   acyclovir (ZOVIRAX) 800 MG tablet Take 1 tablet by mouth twice a day for 5 days as needed   diphenhydramine-acetaminophen (TYLENOL PM) 25-500 MG TABS tablet Take 1 tablet by mouth at bedtime as needed.   LORazepam (ATIVAN) 0.5 MG tablet Take 1 tablet (0.5 mg total) by mouth daily as needed for anxiety.   Multiple Vitamins-Minerals (MULTIVITAMIN WOMEN 50+) TABS Take by mouth.   Probiotic Product (ALIGN PO) Take by mouth.   psyllium (METAMUCIL) 58.6 % powder Take 1 packet by mouth 3 (three) times daily.   PSYLLIUM FIBER PO Take by mouth.   vitamin C (ASCORBIC ACID) 500 MG tablet Take 500 mg by mouth daily.   [DISCONTINUED] triamcinolone cream (KENALOG) 0.1 % Apply 1 Application topically 2 (two) times daily.   No facility-administered encounter medications on file as of 01/21/2023.    ALLERGIES: Allergies  Allergen Reactions   Shellfish Allergy Rash    VACCINATION STATUS: Immunization History  Administered Date(s) Administered   Influenza-Unspecified 03/02/2022   PFIZER(Purple Top)SARS-COV-2 Vaccination 06/03/2019, 06/24/2019, 05/02/2020   Tdap 08/28/2021    Diabetes She presents for her initial diabetic visit. Diabetes type: Prediabetes with A1c of 5.9% Her disease course has been improving. There are no hypoglycemic associated symptoms. Pertinent negatives for hypoglycemia include no confusion, headaches, pallor or seizures. There are no diabetic associated symptoms. Pertinent negatives for diabetes include no chest pain, no polydipsia, no polyphagia and no polyuria. There are no hypoglycemic complications. Symptoms are improving. There are no diabetic complications. Risk factors for coronary artery disease include dyslipidemia, family history and post-menopausal. When asked about current treatments, none were reported. She is compliant with treatment most of the time. Her  weight is decreasing steadily (After making significant changes in her lifestyle, recently she has lost 10 pounds of weight since April 2024.  The most she weighed was under 210 pounds.). She has not had a previous visit with a dietitian. She participates in exercise intermittently. (She is not monitoring blood glucose regularly.  Her point-of-care A1c is 5.5% improving from 5.9%.) An ACE inhibitor/angiotensin II receptor blocker is not being taken.  Hyperlipidemia This is a chronic problem. The current episode started more than 1 year ago. Recent lipid tests were reviewed and are high. Pertinent negatives include no chest pain, myalgias or shortness of breath. Risk factors for coronary artery disease include dyslipidemia and post-menopausal.     Review of Systems  Constitutional:  Negative for chills, fever and unexpected weight change.  HENT:  Negative for trouble swallowing and voice change.   Eyes:  Negative for visual disturbance.  Respiratory:  Negative for cough, shortness of breath and wheezing.   Cardiovascular:  Negative for chest pain, palpitations and leg swelling.  Gastrointestinal:  Negative for diarrhea, nausea and vomiting.  Endocrine: Negative for cold intolerance, heat intolerance, polydipsia, polyphagia and polyuria.  Musculoskeletal:  Negative for arthralgias and myalgias.  Skin:  Negative for color change, pallor, rash and wound.  Neurological:  Negative for seizures and headaches.  Psychiatric/Behavioral:  Negative for confusion and suicidal ideas.     Objective:       01/21/2023    1:47 PM 09/21/2022    1:13 PM 04/27/2022    9:48 AM  Vitals with BMI  Height 5\' 9"  5\' 9"  5\' 9"   Weight 176 lbs 10 oz 185 lbs 6 oz 188 lbs  BMI 26.07 27.36 27.75  Systolic 120 108 425  Diastolic 78 74 80  Pulse 72 69 53    BP 120/78   Pulse 72   Ht 5\' 9"  (1.753 m)   Wt 176 lb 9.6 oz (80.1 kg)   BMI 26.08 kg/m   Wt Readings from Last 3 Encounters:  01/21/23 176 lb 9.6 oz (80.1  kg)  09/21/22 185 lb 6 oz (84.1 kg)  04/27/22 188 lb (85.3 kg)     Physical Exam Constitutional:      Appearance: She is well-developed.  HENT:     Head: Normocephalic and atraumatic.  Neck:     Thyroid: No thyromegaly.     Trachea: No tracheal deviation.  Cardiovascular:     Rate and Rhythm: Normal rate and regular rhythm.  Pulmonary:     Effort: Pulmonary effort is normal.     Breath sounds: Normal breath sounds.  Abdominal:     General: Bowel sounds are normal.     Palpations:  Abdomen is soft.     Tenderness: There is no abdominal tenderness. There is no guarding.  Musculoskeletal:        General: Normal range of motion.     Cervical back: Normal range of motion and neck supple.  Skin:    General: Skin is warm and dry.     Coloration: Skin is not pale.     Findings: No erythema or rash.  Neurological:     Mental Status: She is alert and oriented to person, place, and time.     Cranial Nerves: No cranial nerve deficit.     Coordination: Coordination normal.     Deep Tendon Reflexes: Reflexes are normal and symmetric.  Psychiatric:        Judgment: Judgment normal.     CMP ( most recent) CMP     Component Value Date/Time   NA 138 10/04/2022 0811   K 4.1 10/04/2022 0811   CL 103 10/04/2022 0811   CO2 29 10/04/2022 0811   GLUCOSE 100 (H) 10/04/2022 0811   BUN 16 10/04/2022 0811   CREATININE 0.78 10/04/2022 0811   CALCIUM 10.0 10/04/2022 0811   PROT 6.8 09/21/2022 1358   ALBUMIN 4.3 09/21/2022 1358   AST 18 09/21/2022 1358   ALT 27 09/21/2022 1358   ALKPHOS 66 09/21/2022 1358   BILITOT 0.7 09/21/2022 1358   GFR 80.41 10/04/2022 0811     Diabetic Labs (most recent): Lab Results  Component Value Date   HGBA1C 5.5 (A) 01/21/2023   HGBA1C 5.9 09/21/2022   HGBA1C 5.8 10/06/2021   MICROALBUR 0.8 10/04/2022     Lipid Panel ( most recent) Lipid Panel     Component Value Date/Time   CHOL 215 (H) 10/04/2022 0811   TRIG 73.0 10/04/2022 0811   HDL 51.80  10/04/2022 0811   CHOLHDL 4 10/04/2022 0811   VLDL 14.6 10/04/2022 0811   LDLCALC 148 (H) 10/04/2022 0811   LDLDIRECT 144.0 09/21/2022 1358      Lab Results  Component Value Date   TSH 2.67 09/21/2022      Assessment & Plan:   1. Prediabetes   - SHARRONDA JAKUBCZAK was recently diagnosed with prediabetes and hyperlipidemia.  Her point-of-care A1c is 5.5% improving from 5.9%.   She does not monitor blood glucose regularly.  I have reviewed her recent labs. - I had a long discussion with her about the possible risk factors and  the pathology behind its diabetes and its complications. -She has hyperlipidemia, but denies any history of CAD, CVA, CKD, retinopathy, and neuropathy. These are all discussed in detail with her.  -She has several options to manage her metabolic dysfunction including pharmaceuticals.  I discussed all available options of managing her prediabetes and hyperlipidemia  including de-escalation of medications. I have counseled her on Food as Medicine by adopting a Whole Food , Plant Predominant  ( WFPP) nutrition as recommended by [employer] of Lifestyle Medicine. Patient is encouraged to switch to  unprocessed or minimally processed  complex starch, adequate protein intake (mainly plant source), minimal liquid fat, plenty of fruits, and vegetables. -  she is advised to stick to a routine mealtimes to eat 3 complete meals a day and snack only when necessary ( to snack only to correct hypoglycemia BG <70 day time or <100 at night).   - she acknowledges that there is a room for improvement in her food and drink choices. - Further Specific Suggestion is made for her to avoid simple  carbohydrates  from her diet including Cakes, Sweet Desserts, Ice Cream, Soda (diet and regular), Sweet Tea, Candies, Chips, Cookies, Store Bought Juices, Alcohol ,  Artificial Sweeteners,  Coffee Creamer, and "Sugar-free" Products. This will help patient to have more stable blood glucose  profile and potentially avoid unintended weight gain.  The following Lifestyle Medicine recommendations according to American College of Lifestyle Medicine [facility]) were discussed and offered to patient and she agrees to start the journey:  A.  Whole Foods, Plant-based plate comprising of fruits and vegetables, plant-based proteins, whole-grain carbohydrates was discussed in detail with the patient.   A list for source of those nutrients were also provided to the patient.  Patient will use only water or unsweetened tea for hydration. B.  The need to stay away from risky substances including alcohol, smoking; obtaining 7 to 9 hours of restorative sleep, at least 150 minutes of moderate intensity exercise weekly, the importance of healthy social connections,  and stress reduction techniques were discussed. C.  A full color page of  Calorie density of various food groups per pound showing examples of each food groups was provided to the patient.  - she will be scheduled with Norm Salt, RDN, CDE for individualized diabetes education.  In light of her point-of-care A1c of 5.5%, she will not need medication intervention at this time.  - Specific targets for  A1c;  LDL, HDL,  and Triglycerides were discussed with the patient.  2) Blood Pressure /Hypertension:  her blood pressure is controlled to target.  She is not on blood pressure medications.  Her blood pressure is controlled at 120/78.  3) Lipids/Hyperlipidemia:   Review of her recent lipid panel showed uncontrolled  LDL at 145 .  Lifestyle medicine nutrition discussed above will help her manage dyslipidemia.  She would not be given statins today.  She will have repeat labs in 3 months, if LDL remains above 100, she will be considered for low-dose statin. 4)  Weight/Diet:  Body mass index is 26.08 kg/m.  -She has lost 10 pounds recently.   she is not a candidate for  major weight loss. I discussed with her the fact that loss of 5 - 10% of her   current body weight will have the most impact on her diabetes management.  The above detailed  ACLM recommendations for nutrition, exercise, sleep, social life, avoidance of risky substances, the need for restorative sleep   information will also detailed on discharge instructions.  5) Chronic Care/Health Maintenance:  -she  is not on ACEI/ARB and Statin medications and  is encouraged to initiate and continue to follow up with Ophthalmology, Dentist,  Podiatrist at least yearly or according to recommendations, and advised to   stay away from smoking. I have recommended yearly flu vaccine and pneumonia vaccine at least every 5 years; moderate intensity exercise for up to 150 minutes weekly; and  sleep for 7- 9 hours a day.  - she is  advised to maintain close follow up with [name], NP for primary care needs, as well as her other providers for optimal and coordinated care.   Thank you for involving me in the care of this pleasant patient.  I spent  60  minutes in the care of the patient today including review of labs from CMP, Lipids, Thyroid Function, Hematology (current and previous including abstractions from other facilities); face-to-face time discussing  her blood glucose readings/logs, discussing hypoglycemia and hyperglycemia episodes and symptoms, medications doses, her options of  short and long term treatment based on the latest standards of care / guidelines;  discussion about incorporating lifestyle medicine;  and documenting the encounter. Risk reduction counseling performed per USPSTF guidelines to reduce obesity and cardiovascular risk factors.      Please refer to Patient Instructions for Blood Glucose Monitoring and Insulin/Medications Dosing Guide"  in media tab for additional information. Please  also refer to " Patient Self Inventory" in the Media  tab for reviewed elements of pertinent patient history.  Callie Fielding participated in the discussions, expressed  understanding, and voiced agreement with the above plans.  All questions were answered to her satisfaction. she is encouraged to contact clinic should she have any questions or concerns prior to her return visit.   Follow up plan: - Return in about 3 months (around 04/23/2023) for Fasting Labs  in AM B4 8, A1c -NV.  Marquis Lunch, MD [facility] [address] [place], [place] [address] Phone: [phone]  Fax: [phone]    01/21/2023, 5:58 PM  This note was partially dictated with voice recognition software. Similar sounding words can be transcribed inadequately or may not  be corrected upon review.

## 2023-01-24 ENCOUNTER — Encounter (INDEPENDENT_AMBULATORY_CARE_PROVIDER_SITE_OTHER): Payer: Self-pay

## 2023-02-22 ENCOUNTER — Ambulatory Visit: Payer: BC Managed Care – PPO | Admitting: Nurse Practitioner

## 2023-03-08 ENCOUNTER — Ambulatory Visit (INDEPENDENT_AMBULATORY_CARE_PROVIDER_SITE_OTHER): Payer: Commercial Managed Care - PPO | Admitting: Nurse Practitioner

## 2023-03-08 VITALS — BP 124/82 | HR 52 | Temp 98.0°F | Ht 69.0 in | Wt 177.0 lb

## 2023-03-08 DIAGNOSIS — Z23 Encounter for immunization: Secondary | ICD-10-CM | POA: Diagnosis not present

## 2023-03-08 DIAGNOSIS — E785 Hyperlipidemia, unspecified: Secondary | ICD-10-CM

## 2023-03-08 DIAGNOSIS — R7303 Prediabetes: Secondary | ICD-10-CM

## 2023-03-08 LAB — LIPID PANEL
Cholesterol: 238 mg/dL — ABNORMAL HIGH (ref 0–200)
HDL: 64.4 mg/dL
LDL Cholesterol: 158 mg/dL — ABNORMAL HIGH (ref 0–99)
NonHDL: 173.59
Total CHOL/HDL Ratio: 4
Triglycerides: 80 mg/dL (ref 0.0–149.0)
VLDL: 16 mg/dL (ref 0.0–40.0)

## 2023-03-08 LAB — COMPREHENSIVE METABOLIC PANEL
ALT: 25 U/L (ref 0–35)
AST: 17 U/L (ref 0–37)
Albumin: 4.2 g/dL (ref 3.5–5.2)
Alkaline Phosphatase: 63 U/L (ref 39–117)
BUN: 10 mg/dL (ref 6–23)
CO2: 29 meq/L (ref 19–32)
Calcium: 9.8 mg/dL (ref 8.4–10.5)
Chloride: 102 meq/L (ref 96–112)
Creatinine, Ser: 0.69 mg/dL (ref 0.40–1.20)
GFR: 91.61 mL/min (ref >60.00)
Glucose, Bld: 93 mg/dL (ref 70–99)
Potassium: 4.3 meq/L (ref 3.5–5.1)
Sodium: 137 meq/L (ref 135–145)
Total Bilirubin: 0.7 mg/dL (ref 0.2–1.2)
Total Protein: 6.5 g/dL (ref 6.0–8.3)

## 2023-03-08 LAB — HEMOGLOBIN A1C: Hgb A1c MFr Bld: 6 % (ref 4.6–6.5)

## 2023-03-08 NOTE — Progress Notes (Signed)
Established Patient Office Visit  Subjective   Patient ID: Brianna Morgan, female    DOB: May 04, 1961  Age: 62 y.o. MRN: [id number]  Chief Complaint  Patient presents with   Obesity    Patient has today for follow-up.  She has history of prediabetes, hyperlipidemia (and atherosclerosis) as well as overweight.  Has been trying to focus on lifestyle modification, has been able to lower her A1c back to the normal range at 5.5.  Does see Dr. Fransico Him for management.  Reports that she has lost 15 pounds, but recently regained about 7.  Has been focusing on healthier food choices, reduce carb intake, and exercising regularly.  She is requesting repeat lab work.    Review of Systems  HENT:  Negative for hearing loss and tinnitus.   Respiratory:  Negative for shortness of breath.   Cardiovascular:  Negative for chest pain.      Objective:     BP 124/82   Pulse (!) 52   Temp 98 F (36.7 C) (Temporal)   Ht 5\' 9"  (1.753 m)   Wt 177 lb (80.3 kg)   SpO2 98%   BMI 26.14 kg/m  BP Readings from Last 3 Encounters:  03/08/23 124/82  01/21/23 120/78  09/21/22 108/74   Wt Readings from Last 3 Encounters:  03/08/23 177 lb (80.3 kg)  01/21/23 176 lb 9.6 oz (80.1 kg)  09/21/22 185 lb 6 oz (84.1 kg)      Physical Exam Vitals reviewed.  Constitutional:      General: She is not in acute distress.    Appearance: Normal appearance.  HENT:     Head: Normocephalic and atraumatic.  Neck:     Vascular: No carotid bruit.  Cardiovascular:     Rate and Rhythm: Normal rate and regular rhythm.     Pulses: Normal pulses.     Heart sounds: Normal heart sounds.  Pulmonary:     Effort: Pulmonary effort is normal.     Breath sounds: Normal breath sounds.  Skin:    General: Skin is warm and dry.  Neurological:     General: No focal deficit present.     Mental Status: She is alert and oriented to person, place, and time.  Psychiatric:        Mood and Affect: Mood normal.        Behavior:  Behavior normal.        Judgment: Judgment normal.      No results found for any visits on 03/08/23.    The 10-year ASCVD risk score (Arnett DK, et al., 2019) is: 5.1%    Assessment & Plan:   Problem List Items Addressed This Visit       Other   Hyperlipidemia    Chronic Patient continues off of cholesterol-lowering medication (other than omega-3, she will continue to take this). Has been focusing heavily on lifestyle modification Requesting repeat labs, lipid panel ordered.  Further recommendations may be made based upon these results.      Relevant Orders   Lipid panel   Hemoglobin A1c   Comprehensive metabolic panel   Prediabetes - Primary    Chronic Patient requesting repeat A1c A1c ordered, further recommendations may be made based upon these results Follow-up with Dr. Needed as scheduled Encouraged to continue lifestyle modification      Relevant Orders   Lipid panel   Hemoglobin A1c   Comprehensive metabolic panel   Need for vaccination    Flu shot ministered, VIS  provided      Relevant Orders   Flu vaccine trivalent PF, 6mos and older(Flulaval,Afluria,Fluarix,Fluzone) (Completed)    Return in about 6 months (around 09/05/2023) for CPE with Maralyn Sago.    [name], NP

## 2023-03-08 NOTE — Assessment & Plan Note (Signed)
Chronic Patient requesting repeat A1c A1c ordered, further recommendations may be made based upon these results Follow-up with Dr. Needed as scheduled Encouraged to continue lifestyle modification

## 2023-03-08 NOTE — Assessment & Plan Note (Signed)
Flu shot ministered, VIS provided.

## 2023-03-08 NOTE — Assessment & Plan Note (Signed)
Chronic Patient continues off of cholesterol-lowering medication (other than omega-3, she will continue to take this). Has been focusing heavily on lifestyle modification Requesting repeat labs, lipid panel ordered.  Further recommendations may be made based upon these results.

## 2023-03-19 ENCOUNTER — Ambulatory Visit (INDEPENDENT_AMBULATORY_CARE_PROVIDER_SITE_OTHER): Payer: Commercial Managed Care - PPO

## 2023-03-19 ENCOUNTER — Ambulatory Visit (INDEPENDENT_AMBULATORY_CARE_PROVIDER_SITE_OTHER): Payer: Commercial Managed Care - PPO | Admitting: Internal Medicine

## 2023-03-19 ENCOUNTER — Encounter: Payer: Self-pay | Admitting: Internal Medicine

## 2023-03-19 VITALS — BP 110/74 | HR 55 | Temp 98.0°F | Ht 69.0 in | Wt 177.0 lb

## 2023-03-19 DIAGNOSIS — R052 Subacute cough: Secondary | ICD-10-CM

## 2023-03-19 MED ORDER — AMOXICILLIN-POT CLAVULANATE 875-125 MG PO TABS: 1.0000 | ORAL_TABLET | Freq: Two times a day (BID) | ORAL | 0 refills | Status: AC

## 2023-03-19 NOTE — Patient Instructions (Addendum)
      Have a chest x-ray downstairs.    Medications changes include :   Augmentin twice daily x 1 week      Return if symptoms worsen or fail to improve.

## 2023-03-19 NOTE — Progress Notes (Signed)
Subjective:    Patient ID: Brianna Morgan, female    DOB: [birth year], 62 y.o.   MRN: [id number]      HPI Brianna Morgan is here for  Chief Complaint  Patient presents with   Cough    Deep cough with chest congestion    She is here for an acute visit for cold symptoms.   Her symptoms started end of september.  At that time her lungs were clear, but she feels like mucus has settled deeper in her lungs and not improving.  She has a history of bronchitis and feels like she has that.  She also wanted to rule out the possibility of pneumonia.  She is experiencing cough x few weeks.  Has wet cough that is productive at times - mucus is thick.  She has difficulty getting mucus out, but when she does it is slightly discolored.  She has been sneezing at times.  She denies any shortness of breath, fevers, other cold symptoms or allergy symptoms.  She has tried taking mucinex.       Medications and allergies reviewed with patient and updated if appropriate.  Current Outpatient Medications on File Prior to Visit  Medication Sig Dispense Refill   acyclovir (ZOVIRAX) 800 MG tablet Take 1 tablet by mouth twice a day for 5 days as needed 10 tablet 3   Calcium Carbonate Antacid (TUMS PO) Take 1-2 tablets by mouth as needed.     DHA-EPA-Vitamin E (OMEGA-3 COMPLEX PO) Take 2 capsules by mouth daily.     diphenhydramine-acetaminophen (TYLENOL PM) 25-500 MG TABS tablet Take 1 tablet by mouth at bedtime as needed.     famotidine (PEPCID) 20 MG tablet Take 20 mg by mouth daily.     LORazepam (ATIVAN) 0.5 MG tablet Take 1 tablet (0.5 mg total) by mouth daily as needed for anxiety. 30 tablet 0   Multiple Vitamins-Minerals (MULTIVITAMIN WOMEN 50+) TABS Take by mouth.     OVER THE COUNTER MEDICATION Magnesium Lysinate Glycinate 2 tabs each morning and 2 tabs each evening     Probiotic Product (ALIGN PO) Take by mouth.     psyllium (METAMUCIL) 58.6 % powder Take 1 packet by mouth 3 (three) times daily.      PSYLLIUM FIBER PO Take by mouth.     vitamin C (ASCORBIC ACID) 500 MG tablet Take 500 mg by mouth daily.     No current facility-administered medications on file prior to visit.    Review of Systems  Constitutional:  Negative for fever.  HENT:  Negative for congestion, sinus pain and sore throat.   Respiratory:  Positive for cough (productive of thick mucus at times.) and wheezing. Negative for chest tightness and shortness of breath.   Neurological:  Negative for light-headedness and headaches.       Objective:   Vitals:   03/19/23 1602  BP: 110/74  Pulse: (!) 55  Temp: 98 F (36.7 C)  SpO2: 96%   BP Readings from Last 3 Encounters:  03/19/23 110/74  03/08/23 124/82  01/21/23 120/78   Wt Readings from Last 3 Encounters:  03/19/23 177 lb (80.3 kg)  03/08/23 177 lb (80.3 kg)  01/21/23 176 lb 9.6 oz (80.1 kg)   Body mass index is 26.14 kg/m.    Physical Exam Constitutional:      General: She is not in acute distress.    Appearance: Normal appearance. She is not ill-appearing.  HENT:     Head: Normocephalic and atraumatic.  Right Ear: Tympanic membrane, ear canal and external ear normal.     Left Ear: Tympanic membrane, ear canal and external ear normal.     Mouth/Throat:     Mouth: Mucous membranes are moist.     Pharynx: No oropharyngeal exudate or posterior oropharyngeal erythema.  Eyes:     Conjunctiva/sclera: Conjunctivae normal.  Cardiovascular:     Rate and Rhythm: Normal rate and regular rhythm.  Pulmonary:     Effort: Pulmonary effort is normal. No respiratory distress.     Breath sounds: Normal breath sounds. No wheezing or rales.  Musculoskeletal:     Cervical back: Neck supple. No tenderness.  Lymphadenopathy:     Cervical: No cervical adenopathy.  Skin:    General: Skin is warm and dry.  Neurological:     Mental Status: She is alert.            Assessment & Plan:    Subacute cough: Ongoing for a few weeks-has thick chest and  having difficulty getting out of her airways-slightly discolored, occasional wheeze Doubt she has pneumonia, but will get a chest x-ray to make sure given duration of cough Will start Augmentin 875-125 mg twice daily x 7 days Continue Mucinex, continue increased fluids Call if symptoms do not improve/resolve

## 2023-03-22 ENCOUNTER — Telehealth: Payer: Self-pay | Admitting: Nurse Practitioner

## 2023-03-22 DIAGNOSIS — R059 Cough, unspecified: Secondary | ICD-10-CM

## 2023-03-22 MED ORDER — AZITHROMYCIN 250 MG PO TABS
ORAL_TABLET | ORAL | 0 refills | Status: AC
Start: 2023-03-22 — End: 2023-03-27

## 2023-03-22 NOTE — Telephone Encounter (Signed)
Patient has taken the amoxicillin since Tuesday and does not feel like it is helping.  She would like to be put on Zithromax.  Please call patient and advise.  [phone]

## 2023-03-25 NOTE — Telephone Encounter (Signed)
Pt called stating she have notice that she have been having some wheezing go on about two days and wanting to know could she get prescribed an inhaler. Please advise.

## 2023-03-26 ENCOUNTER — Other Ambulatory Visit: Payer: Self-pay | Admitting: Family

## 2023-03-26 ENCOUNTER — Telehealth: Payer: Self-pay

## 2023-03-26 ENCOUNTER — Other Ambulatory Visit: Payer: Self-pay | Admitting: Internal Medicine

## 2023-03-26 DIAGNOSIS — R059 Cough, unspecified: Secondary | ICD-10-CM

## 2023-03-26 DIAGNOSIS — R062 Wheezing: Secondary | ICD-10-CM

## 2023-03-26 MED ORDER — ALBUTEROL SULFATE (2.5 MG/3ML) 0.083% IN NEBU: 2.5000 mg | INHALATION_SOLUTION | Freq: Four times a day (QID) | RESPIRATORY_TRACT | 1 refills | Status: DC | PRN

## 2023-03-26 MED ORDER — ALBUTEROL SULFATE HFA 108 (90 BASE) MCG/ACT IN AERS
2.0000 | INHALATION_SPRAY | Freq: Four times a day (QID) | RESPIRATORY_TRACT | 0 refills | Status: DC | PRN
Start: 2023-03-26 — End: 2023-03-27

## 2023-03-26 NOTE — Addendum Note (Signed)
Addended by: Theressa Stamps on: 03/26/2023 04:44 PM   Modules accepted: Orders

## 2023-03-26 NOTE — Telephone Encounter (Signed)
Patient said that a nebulizer solution was ordered - she needs an inhaler - please resend and let patient know when this has been done .

## 2023-03-26 NOTE — Telephone Encounter (Signed)
Patient called in requesting an inhaler as she has been wheezing more over the past 24 hours, even at rest. Patient was sent in RX but it was nebulizer not inhaler. Reviewed with Lawerance Bach, MD and agreed for patient to get inhaler, and Rx for nebulizer was discontinued.   Made patient aware via MyChart and verified patient pharmacy.

## 2023-03-27 ENCOUNTER — Other Ambulatory Visit: Payer: Self-pay | Admitting: Internal Medicine

## 2023-03-27 DIAGNOSIS — R062 Wheezing: Secondary | ICD-10-CM

## 2023-03-27 DIAGNOSIS — R059 Cough, unspecified: Secondary | ICD-10-CM

## 2023-03-27 MED ORDER — ALBUTEROL SULFATE HFA 108 (90 BASE) MCG/ACT IN AERS: 2.0000 | INHALATION_SPRAY | Freq: Four times a day (QID) | RESPIRATORY_TRACT | 0 refills | Status: AC | PRN

## 2023-05-11 LAB — LIPID PANEL
Chol/HDL Ratio: 3.4 {ratio} (ref 0.0–4.4)
Cholesterol, Total: 238 mg/dL — ABNORMAL HIGH (ref 100–199)
HDL: 70 mg/dL (ref >39)
LDL Chol Calc (NIH): 155 mg/dL — ABNORMAL HIGH (ref 0–99)
Triglycerides: 75 mg/dL (ref 0–149)
VLDL Cholesterol Cal: 13 mg/dL (ref 5–40)

## 2023-05-20 ENCOUNTER — Ambulatory Visit: Payer: Commercial Managed Care - PPO | Admitting: "Endocrinology

## 2023-05-20 ENCOUNTER — Encounter: Payer: Self-pay | Admitting: "Endocrinology

## 2023-05-20 VITALS — BP 120/82 | HR 60 | Ht 69.0 in | Wt 181.0 lb

## 2023-05-20 DIAGNOSIS — R7303 Prediabetes: Secondary | ICD-10-CM | POA: Diagnosis not present

## 2023-05-20 DIAGNOSIS — E782 Mixed hyperlipidemia: Secondary | ICD-10-CM

## 2023-05-20 NOTE — Patient Instructions (Signed)
                                     Advice for Weight Management  -For most of us the best way to lose weight is by diet management. Generally speaking, diet management means consuming less calories intentionally which over time brings about progressive weight loss.  This can be achieved more effectively by avoiding ultra processed carbohydrates, processed meats, unhealthy fats.    It is critically important to know your numbers: how much calorie you are consuming and how much calorie you need. More importantly, our carbohydrates sources should be unprocessed naturally occurring  complex starch food items.  It is always important to balance nutrition also by  appropriate intake of proteins (mainly plant-based), healthy fats/oils, plenty of fruits and vegetables.   -The American College of Lifestyle Medicine (ACL M) recommends nutrition derived mostly from Whole Food, Plant Predominant Sources example an apple instead of applesauce or apple pie. Eat Plenty of vegetables, Mushrooms, fruits, Legumes, Whole Grains, Nuts, seeds in lieu of processed meats, processed snacks/pastries red meat, poultry, eggs.  Use only water or unsweetened tea for hydration.  The College also recommends the need to stay away from risky substances including alcohol, smoking; obtaining 7-9 hours of restorative sleep, at least 150 minutes of moderate intensity exercise weekly, importance of healthy social connections, and being mindful of stress and seek help when it is overwhelming.    -Sticking to a routine mealtime to eat 3 meals a day and avoiding unnecessary snacks is shown to have a big role in weight control. Under normal circumstances, the only time we burn stored energy is when we are hungry, so allow  some hunger to take place- hunger means no food between appropriate meal times, only water.  It is not advisable to starve.   -It is better to avoid simple carbohydrates including:  Cakes, Sweet Desserts, Ice Cream, Soda (diet and regular), Sweet Tea, Candies, Chips, Cookies, Store Bought Juices, Alcohol in Excess of  1-2 drinks a day, Lemonade,  Artificial Sweeteners, Doughnuts, Coffee Creamers, "Sugar-free" Products, etc, etc.  This is not a complete list.....    -Consulting with certified diabetes educators is proven to provide you with the most accurate and current information on diet.  Also, you may be  interested in discussing diet options/exchanges , we can schedule a visit with Brianna Morgan, RDN, CDE for individualized nutrition education.  -Exercise: If you are able: 30 -60 minutes a day ,4 days a week, or 150 minutes of moderate intensity exercise weekly.    The longer the better if tolerated.  Combine stretch, strength, and aerobic activities.  If you were told in the past that you have high risk for cardiovascular diseases, or if you are currently symptomatic, you may seek evaluation by your heart doctor prior to initiating moderate to intense exercise programs.                                  Additional Care Considerations for Diabetes/Prediabetes   -Diabetes  is a chronic disease.  The most important care consideration is regular follow-up with your diabetes care provider with the goal being avoiding or delaying its complications and to take advantage of advances in medications and technology.  If appropriate actions are taken early enough, type 2 diabetes can even be   reversed.  Seek information from the right source.  - Whole Food, Plant Predominant Nutrition is highly recommended: Eat Plenty of vegetables, Mushrooms, fruits, Legumes, Whole Grains, Nuts, seeds in lieu of processed meats, processed snacks/pastries red meat, poultry, eggs as recommended by American College of  Lifestyle Medicine (ACLM).  -Type 2 diabetes is known to coexist with other important comorbidities such as high blood pressure and high cholesterol.  It is critical to control not only the  diabetes but also the high blood pressure and high cholesterol to minimize and delay the risk of complications including coronary artery disease, stroke, amputations, blindness, etc.  The good news is that this diet recommendation for type 2 diabetes is also very helpful for managing high cholesterol and high blood blood pressure.  - Studies showed that people with diabetes will benefit from a class of medications known as ACE inhibitors and statins.  Unless there are specific reasons not to be on these medications, the standard of care is to consider getting one from these groups of medications at an optimal doses.  These medications are generally considered safe and proven to help protect the heart and the kidneys.    - People with diabetes are encouraged to initiate and maintain regular follow-up with eye doctors, foot doctors, dentists , and if necessary heart and kidney doctors.     - It is highly recommended that people with diabetes quit smoking or stay away from smoking, and get yearly  flu vaccine and pneumonia vaccine at least every 5 years.  See above for additional recommendations on exercise, sleep, stress management , and healthy social connections.      

## 2023-05-20 NOTE — Progress Notes (Signed)
Endocrinology Consult Note       05/20/2023, 4:48 PM   Subjective:    Patient ID: Brianna Morgan, female    DOB: [birth year].  Brianna Morgan is being seen in consultation for management of currently uncontrolled symptomatic diabetes requested by  [name], NP.   Past Medical History:  Diagnosis Date   Nonintractable headache 01/19/2022    Past Surgical History:  Procedure Laterality Date   ABDOMINAL HYSTERECTOMY     COLONOSCOPY  2024   ESOPHAGOGASTRODUODENOSCOPY ENDOSCOPY  2024   PARATHYROIDECTOMY     REPLACEMENT TOTAL KNEE Right 2020    Social History   Socioeconomic History   Marital status: Married    Spouse name: Not on file   Number of children: Not on file   Years of education: Not on file   Highest education level: Not on file  Occupational History   Not on file  Tobacco Use   Smoking status: Never   Smokeless tobacco: Never  Vaping Use   Vaping status: Never Used  Substance and Sexual Activity   Alcohol use: Yes    Comment: occ   Drug use: Never   Sexual activity: Not on file  Other Topics Concern   Not on file  Social History Narrative   Not on file   Social Determinants of Health   Financial Resource Strain: Not on file  Food Insecurity: Not on file  Transportation Needs: Not on file  Physical Activity: Not on file  Stress: Not on file  Social Connections: Not on file    Family History  Problem Relation Age of Onset   Cancer Mother    Thyroid disease Mother    Osteoporosis Mother    Hypertension Father    Hyperlipidemia Father    Heart attack Father    Breast cancer Neg Hx     Outpatient Encounter Medications as of 05/20/2023  Medication Sig   acyclovir (ZOVIRAX) 800 MG tablet Take 1 tablet by mouth twice a day for 5 days as needed   albuterol (VENTOLIN HFA) 108 (90 Base) MCG/ACT inhaler Inhale 2 puffs into the lungs every 6 (six) hours as needed  for wheezing or shortness of breath.   Calcium Carbonate Antacid (TUMS PO) Take 1-2 tablets by mouth as needed.   DHA-EPA-Vitamin E (OMEGA-3 COMPLEX PO) Take 2 capsules by mouth daily.   diphenhydramine-acetaminophen (TYLENOL PM) 25-500 MG TABS tablet Take 1 tablet by mouth at bedtime as needed.   famotidine (PEPCID) 20 MG tablet Take 20 mg by mouth daily. (Patient not taking: Reported on 05/20/2023)   LORazepam (ATIVAN) 0.5 MG tablet Take 1 tablet (0.5 mg total) by mouth daily as needed for anxiety.   Multiple Vitamins-Minerals (MULTIVITAMIN WOMEN 50+) TABS Take by mouth.   OVER THE COUNTER MEDICATION Magnesium Lysinate Glycinate 2 tabs each morning and 2 tabs each evening   Probiotic Product (ALIGN PO) Take by mouth.   psyllium (METAMUCIL) 58.6 % powder Take 1 packet by mouth 3 (three) times daily.   PSYLLIUM FIBER PO Take by mouth.   vitamin C (ASCORBIC ACID) 500 MG tablet Take 500 mg by  mouth daily.   No facility-administered encounter medications on file as of 05/20/2023.    ALLERGIES: Allergies  Allergen Reactions   Shellfish Allergy Rash    VACCINATION STATUS: Immunization History  Administered Date(s) Administered   Influenza, Seasonal, Injecte, Preservative Fre 03/08/2023   Influenza-Unspecified 03/02/2022   PFIZER(Purple Top)SARS-COV-2 Vaccination 06/03/2019, 06/24/2019, 05/02/2020   Tdap 08/28/2021    Diabetes She presents for her follow-up diabetic visit. Diabetes type: Prediabetes with A1c of 5.9% Her disease course has been stable. There are no hypoglycemic associated symptoms. Pertinent negatives for hypoglycemia include no confusion, headaches, pallor or seizures. There are no diabetic associated symptoms. Pertinent negatives for diabetes include no chest pain, no polydipsia, no polyphagia and no polyuria. There are no hypoglycemic complications. Symptoms are improving. There are no diabetic complications. Risk factors for coronary artery disease include dyslipidemia,  family history and post-menopausal. When asked about current treatments, none were reported. She is compliant with treatment most of the time. Her weight is decreasing steadily (After making significant changes in her lifestyle, recently she has lost 10 pounds of weight since April 2024.  The most she weighed was under 210 pounds.). She has not had a previous visit with a dietitian. She participates in exercise intermittently. There is no change in her home blood glucose trend. (She is not monitoring blood glucose regularly.  Her previsit A1c was 6%, previously 5.5% in 5.9%.   ) An ACE inhibitor/angiotensin II receptor blocker is not being taken.  Hyperlipidemia This is a chronic problem. The current episode started more than 1 year ago. The problem is uncontrolled. Recent lipid tests were reviewed and are high. Pertinent negatives include no chest pain, myalgias or shortness of breath. She is currently on no antihyperlipidemic treatment. Risk factors for coronary artery disease include dyslipidemia and post-menopausal.     Review of Systems  Constitutional:  Negative for chills, fever and unexpected weight change.  HENT:  Negative for trouble swallowing and voice change.   Eyes:  Negative for visual disturbance.  Respiratory:  Negative for cough, shortness of breath and wheezing.   Cardiovascular:  Negative for chest pain, palpitations and leg swelling.  Gastrointestinal:  Negative for diarrhea, nausea and vomiting.  Endocrine: Negative for cold intolerance, heat intolerance, polydipsia, polyphagia and polyuria.  Musculoskeletal:  Negative for arthralgias and myalgias.  Skin:  Negative for color change, pallor, rash and wound.  Neurological:  Negative for seizures and headaches.  Psychiatric/Behavioral:  Negative for confusion and suicidal ideas.     Objective:       05/20/2023    3:03 PM 03/19/2023    4:02 PM 03/08/2023    8:57 AM  Vitals with BMI  Height 5\' 9"  5\' 9"  5\' 9"   Weight 181  lbs 177 lbs 177 lbs  BMI 26.72 26.13 26.13  Systolic 120 110 098  Diastolic 82 74 82  Pulse 60 55 52    BP 120/82   Pulse 60   Ht 5\' 9"  (1.753 m)   Wt 181 lb (82.1 kg)   BMI 26.73 kg/m   Wt Readings from Last 3 Encounters:  05/20/23 181 lb (82.1 kg)  03/19/23 177 lb (80.3 kg)  03/08/23 177 lb (80.3 kg)       CMP ( most recent) CMP     Component Value Date/Time   NA 137 03/08/2023 0928   K 4.3 03/08/2023 0928   CL 102 03/08/2023 0928   CO2 29 03/08/2023 0928   GLUCOSE 93 03/08/2023 0928   BUN  10 03/08/2023 0928   CREATININE 0.69 03/08/2023 0928   CALCIUM 9.8 03/08/2023 0928   PROT 6.5 03/08/2023 0928   ALBUMIN 4.2 03/08/2023 0928   AST 17 03/08/2023 0928   ALT 25 03/08/2023 0928   ALKPHOS 63 03/08/2023 0928   BILITOT 0.7 03/08/2023 0928   GFR 91.61 03/08/2023 0928     Diabetic Labs (most recent): Lab Results  Component Value Date   HGBA1C 6.0 03/08/2023   HGBA1C 5.5 (A) 01/21/2023   HGBA1C 5.9 09/21/2022   MICROALBUR 0.8 10/04/2022     Lipid Panel ( most recent) Lipid Panel     Component Value Date/Time   CHOL 238 (H) 05/10/2023 0820   TRIG 75 05/10/2023 0820   HDL 70 05/10/2023 0820   CHOLHDL 3.4 05/10/2023 0820   CHOLHDL 4 03/08/2023 0928   VLDL 16.0 03/08/2023 0928   LDLCALC 155 (H) 05/10/2023 0820   LDLDIRECT 144.0 09/21/2022 1358   LABVLDL 13 05/10/2023 0820      Lab Results  Component Value Date   TSH 2.67 09/21/2022      Assessment & Plan:   1. Prediabetes   - Brianna Morgan was recently diagnosed with prediabetes and hyperlipidemia.    -Her previsit labs showed A1c of 6%.   I have reviewed her recent labs. - I had a long discussion with her about the possible risk factors and  the pathology behind its diabetes and its complications. -She has hyperlipidemia, but denies any history of CAD, CVA, CKD, retinopathy, and neuropathy. These are all discussed in detail with her.  -She does not need pharmaceutical intervention for  prediabetes/diabetes.  In light of her desire to avoid medications, she has several options to manage her metabolic dysfunction.   I discussed all available options of managing her prediabetes and hyperlipidemia  including de-escalation of medications. I have counseled her on Food as Medicine by adopting a Whole Food , Plant Predominant  ( WFPP) nutrition as recommended by [employer] of Lifestyle Medicine. Patient is encouraged to switch to  unprocessed or minimally processed  complex starch, adequate protein intake (mainly plant source), minimal liquid fat, plenty of fruits, and vegetables. -  she is advised to stick to a routine mealtimes to eat 3 complete meals a day and snack only when necessary ( to snack only to correct hypoglycemia BG <70 day time or <100 at night).   - she acknowledges that there is a room for improvement in her food and drink choices. - Suggestion is made for her to avoid simple carbohydrates  from her diet including Cakes, Sweet Desserts, Ice Cream, Soda (diet and regular), Sweet Tea, Candies, Chips, Cookies, Store Bought Juices, Alcohol , Artificial Sweeteners,  Coffee Creamer, and "Sugar-free" Products, Lemonade. This will help patient to have more stable blood glucose profile and potentially avoid unintended weight gain.  The following Lifestyle Medicine recommendations according to American College of Lifestyle Medicine  [facility]) were discussed and and offered to patient and she  agrees to start the journey:  A. Whole Foods, Plant-Based Nutrition comprising of fruits and vegetables, plant-based proteins, whole-grain carbohydrates was discussed in detail with the patient.   A list for source of those nutrients were also provided to the patient.  Patient will use only water or unsweetened tea for hydration. B.  The need to stay away from risky substances including alcohol, smoking; obtaining 7 to 9 hours of restorative sleep, at least 150 minutes of moderate intensity  exercise weekly, the importance  of healthy social connections,  and stress management techniques were discussed. C.  A full color page of  Calorie density of various food groups per pound showing examples of each food groups was provided to the patient.   - Specific targets for  A1c;  LDL, HDL,  and Triglycerides were discussed with the patient.  2) Blood Pressure /Hypertension:   Her blood pressure is controlled to target.  She is not on blood pressure medications.  Her blood pressure is controlled at 120/78.  3) Lipids/Hyperlipidemia:   Review of her recent lipid panel showed uncontrolled  LDL at 155 .  She would like to avoid antilipid medications including statins at this time.  Lifestyle medicine nutrition discussed above will help her manage dyslipidemia.   She will have repeat labs in 3 months, she plans to consider statin medications if she cannot achieve lipid control by next visit.    4)  Weight/Diet:  Body mass index is 26.73 kg/m.  -  she is a candidate for  some weight loss. I discussed with her the fact that loss of 5 - 10% of her  current body weight will have the most impact on her diabetes management.  The above detailed  ACLM recommendations for nutrition, exercise, sleep, social life, avoidance of risky substances, the need for restorative sleep   information will also detailed on discharge instructions.  5) Chronic Care/Health Maintenance:  -she  is not on ACEI/ARB and Statin medications and  is encouraged to initiate and continue to follow up with Ophthalmology, Dentist,  Podiatrist at least yearly or according to recommendations, and advised to   stay away from smoking. I have recommended yearly flu vaccine and pneumonia vaccine at least every 5 years; moderate intensity exercise for up to 150 minutes weekly; and  sleep for 7- 9 hours a day.  - she is  advised to maintain close follow up with [name], NP for primary care needs, as well as her other providers for  optimal and coordinated care.  I spent  26  minutes in the care of the patient today including review of labs from CMP, Lipids, Thyroid Function, Hematology (current and previous including abstractions from other facilities); face-to-face time discussing  her blood glucose readings/logs, discussing hypoglycemia and hyperglycemia episodes and symptoms, medications doses, her options of short and long term treatment based on the latest standards of care / guidelines;  discussion about incorporating lifestyle medicine;  and documenting the encounter. Risk reduction counseling performed per USPSTF guidelines to reduce cardiovascular risk factors.     Please refer to Patient Instructions for Blood Glucose Monitoring and Insulin/Medications Dosing Guide"  in media tab for additional information. Please  also refer to " Patient Self Inventory" in the Media  tab for reviewed elements of pertinent patient history.  Brianna Morgan participated in the discussions, expressed understanding, and voiced agreement with the above plans.  All questions were answered to her satisfaction. she is encouraged to contact clinic should she have any questions or concerns prior to her return visit.   Follow up plan: - Return in about 3 months (around 08/18/2023) for Fasting Labs  in AM B4 8, A1c -NV.  Marquis Lunch, MD [facility] [address] [place], [place] [address] Phone: [phone]  Fax: [phone]    05/20/2023, 4:48 PM  This note was partially dictated with voice recognition software. Similar sounding words can be transcribed inadequately or may not  be corrected  upon review.

## 2023-05-23 ENCOUNTER — Encounter: Payer: Self-pay | Admitting: Nurse Practitioner

## 2023-05-24 ENCOUNTER — Other Ambulatory Visit: Payer: Self-pay | Admitting: Nurse Practitioner

## 2023-05-24 DIAGNOSIS — E785 Hyperlipidemia, unspecified: Secondary | ICD-10-CM

## 2023-05-24 DIAGNOSIS — R7303 Prediabetes: Secondary | ICD-10-CM

## 2023-05-31 ENCOUNTER — Other Ambulatory Visit (INDEPENDENT_AMBULATORY_CARE_PROVIDER_SITE_OTHER): Payer: Commercial Managed Care - PPO

## 2023-05-31 ENCOUNTER — Ambulatory Visit (HOSPITAL_COMMUNITY)
Admission: RE | Admit: 2023-05-31 | Discharge: 2023-05-31 | Disposition: A | Discharge status: Home / Self Care | Payer: Self-pay | Source: Ambulatory Visit | Attending: Nurse Practitioner | Admitting: Nurse Practitioner

## 2023-05-31 DIAGNOSIS — E785 Hyperlipidemia, unspecified: Secondary | ICD-10-CM | POA: Diagnosis not present

## 2023-05-31 DIAGNOSIS — R7303 Prediabetes: Secondary | ICD-10-CM

## 2023-05-31 LAB — BASIC METABOLIC PANEL
BUN: 18 mg/dL (ref 6–23)
CO2: 29 meq/L (ref 19–32)
Calcium: 9.6 mg/dL (ref 8.4–10.5)
Chloride: 106 meq/L (ref 96–112)
Creatinine, Ser: 0.66 mg/dL (ref 0.40–1.20)
GFR: 92.44 mL/min (ref >60.00)
Glucose, Bld: 101 mg/dL — ABNORMAL HIGH (ref 70–99)
Potassium: 4.2 meq/L (ref 3.5–5.1)
Sodium: 140 meq/L (ref 135–145)

## 2023-05-31 LAB — C-REACTIVE PROTEIN: CRP: <1 mg/dL (ref 0.5–20.0)

## 2023-06-01 LAB — APOLIPOPROTEIN B: Apolipoprotein B: 112 mg/dL — ABNORMAL HIGH (ref <90)

## 2023-06-02 LAB — HOMOCYSTEINE: Homocysteine: 7.2 umol/L (ref <10.4)

## 2023-06-03 LAB — INSULIN, RANDOM: Insulin: 7.2 u[IU]/mL

## 2023-07-19 ENCOUNTER — Ambulatory Visit (INDEPENDENT_AMBULATORY_CARE_PROVIDER_SITE_OTHER): Payer: Medicare Other | Admitting: Nurse Practitioner

## 2023-07-19 ENCOUNTER — Encounter: Payer: Self-pay | Admitting: Nurse Practitioner

## 2023-07-19 VITALS — BP 110/74 | HR 63 | Temp 98.0°F | Ht 69.0 in | Wt 182.2 lb

## 2023-07-19 DIAGNOSIS — Z8639 Personal history of other endocrine, nutritional and metabolic disease: Secondary | ICD-10-CM

## 2023-07-19 DIAGNOSIS — R7303 Prediabetes: Secondary | ICD-10-CM

## 2023-07-19 DIAGNOSIS — E785 Hyperlipidemia, unspecified: Secondary | ICD-10-CM

## 2023-07-19 DIAGNOSIS — E663 Overweight: Secondary | ICD-10-CM

## 2023-07-19 DIAGNOSIS — Z0001 Encounter for general adult medical examination with abnormal findings: Secondary | ICD-10-CM | POA: Insufficient documentation

## 2023-07-19 MED ORDER — FREESTYLE LIBRE 3 PLUS SENSOR MISC: 3 refills | Status: DC

## 2023-07-19 NOTE — Assessment & Plan Note (Signed)
 Healthy lifestyle, screening recommendations, vaccine recommendations discussed.  Handout provided.

## 2023-07-19 NOTE — Progress Notes (Signed)
 Complete physical exam  Patient: Brianna Morgan   DOB: [birth year]   63 y.o. Female  MRN: [id number]  Subjective:    Chief Complaint  Patient presents with   Annual Exam    Brianna Morgan is a 63 y.o. female who presents today for a complete physical exam. She reports consuming a  diet following weight watchers  diet.  Exercise: HIIT at [facility] 2x a week, walking 3/week, ahoy class once a week  She generally feels well. She reports sleeping well. She does not have additional problems to discuss today.   Atherosclerosis: She underwent calcium CT score which showed a score of 0, but radiology over read did identify atherosclerotic plaques.  At this time patient would prefer to focus on lifestyle management and does not want to take pharmacological treatment.  Prediabetes: She would like to consider CGM to monitor blood sugars while she is focusing on dietary changes.  Most recent fall risk assessment:    03/08/2023    8:51 AM  Fall Risk   Falls in the past year? 0  Number falls in past yr: 0  Injury with Fall? 0  Risk for fall due to : No Fall Risks  Follow up Falls evaluation completed     Most recent depression screenings:    03/08/2023    8:51 AM 09/21/2022    1:11 PM  PHQ 2/9 Scores  PHQ - 2 Score 0 0    Vision:Within last year and Dental: Current dental problems and Receives regular dental care (natural root canal and body is rejecting tooth likely will need implant.  Past Medical History:  Diagnosis Date   Nonintractable headache 01/19/2022   Past Surgical History:  Procedure Laterality Date   ABDOMINAL HYSTERECTOMY     COLONOSCOPY  2024   ESOPHAGOGASTRODUODENOSCOPY ENDOSCOPY  2024   PARATHYROIDECTOMY     REPLACEMENT TOTAL KNEE Right 2020   Social History   Socioeconomic History   Marital status: Married    Spouse name: Not on file   Number of children: Not on file   Years of education: Not on file   Highest education level: Not on file   Occupational History   Not on file  Tobacco Use   Smoking status: Never   Smokeless tobacco: Never  Vaping Use   Vaping status: Never Used  Substance and Sexual Activity   Alcohol use: Yes    Comment: occ   Drug use: Never   Sexual activity: Not on file  Other Topics Concern   Not on file  Social History Narrative   Not on file   Social Drivers of Health   Financial Resource Strain: Not on file  Food Insecurity: Not on file  Transportation Needs: Not on file  Physical Activity: Not on file  Stress: Not on file  Social Connections: Not on file  Intimate Partner Violence: Not on file   Family History  Problem Relation Age of Onset   Cancer Mother    Thyroid  disease Mother    Osteoporosis Mother    Hypertension Father    Hyperlipidemia Father    Heart attack Father    Breast cancer Neg Hx    Allergies  Allergen Reactions   Shellfish Allergy Rash      Patient Care Team: Elnor Lauraine BRAVO, NP as PCP - General (Nurse Practitioner)   Outpatient Medications Prior to Visit  Medication Sig   acyclovir  (ZOVIRAX ) 800 MG tablet Take 1 tablet by mouth twice a  day for 5 days as needed   albuterol  (VENTOLIN  HFA) 108 (90 Base) MCG/ACT inhaler Inhale 2 puffs into the lungs every 6 (six) hours as needed for wheezing or shortness of breath.   Calcium Carbonate Antacid (TUMS PO) Take 1-2 tablets by mouth as needed.   DHA-EPA-Vitamin E (OMEGA-3 COMPLEX PO) Take 2 capsules by mouth daily.   diphenhydramine-acetaminophen  (TYLENOL  PM) 25-500 MG TABS tablet Take 1 tablet by mouth at bedtime as needed.   famotidine (PEPCID) 20 MG tablet Take 20 mg by mouth daily.   LORazepam  (ATIVAN ) 0.5 MG tablet Take 1 tablet (0.5 mg total) by mouth daily as needed for anxiety.   Multiple Vitamins-Minerals (MULTIVITAMIN WOMEN 50+) TABS Take by mouth.   OVER THE COUNTER MEDICATION Magnesium Lysinate Glycinate 2 tabs each morning and 2 tabs each evening   Probiotic Product (ALIGN PO) Take by mouth.    psyllium (METAMUCIL) 58.6 % powder Take 1 packet by mouth 3 (three) times daily.   PSYLLIUM FIBER PO Take by mouth.   vitamin C (ASCORBIC ACID) 500 MG tablet Take 500 mg by mouth daily.   No facility-administered medications prior to visit.    Review of Systems  Constitutional:  Negative for chills and fever.  HENT:  Negative for tinnitus.   Eyes:  Negative for blurred vision and double vision.  Respiratory:  Negative for cough and wheezing.   Cardiovascular:  Negative for chest pain and palpitations.  Gastrointestinal:  Negative for abdominal pain and blood in stool.  Genitourinary:  Negative for dysuria and hematuria.  Skin:  Negative for itching and rash.  Neurological:  Negative for seizures and loss of consciousness.  Psychiatric/Behavioral:  Negative for depression and suicidal ideas. The patient is not nervous/anxious.           Objective:     BP 110/74   Pulse 63   Temp 98 F (36.7 C) (Temporal)   Ht 5' 9 (1.753 m)   Wt 182 lb 4 oz (82.7 kg)   SpO2 96%   BMI 26.91 kg/m  BP Readings from Last 3 Encounters:  07/19/23 110/74  05/20/23 120/82  03/19/23 110/74   Wt Readings from Last 3 Encounters:  07/19/23 182 lb 4 oz (82.7 kg)  05/20/23 181 lb (82.1 kg)  03/19/23 177 lb (80.3 kg)      Physical Exam Vitals reviewed. Exam conducted with a chaperone present.  Constitutional:      Appearance: Normal appearance.  HENT:     Head: Normocephalic and atraumatic.     Right Ear: Tympanic membrane, ear canal and external ear normal.     Left Ear: Tympanic membrane, ear canal and external ear normal.  Eyes:     General:        Right eye: No discharge.        Left eye: No discharge.     Extraocular Movements: Extraocular movements intact.     Conjunctiva/sclera: Conjunctivae normal.     Pupils: Pupils are equal, round, and reactive to light.  Neck:     Vascular: No carotid bruit.  Cardiovascular:     Rate and Rhythm: Normal rate and regular rhythm.      Pulses: Normal pulses.     Heart sounds: Normal heart sounds. No murmur heard. Pulmonary:     Effort: Pulmonary effort is normal.     Breath sounds: Normal breath sounds.  Chest:  Breasts:    Breasts are symmetrical.     Right: Normal.  Left: Normal.  Abdominal:     General: Abdomen is flat. Bowel sounds are normal. There is no distension.     Palpations: Abdomen is soft. There is no mass.     Tenderness: There is no abdominal tenderness.  Musculoskeletal:        General: No tenderness.     Cervical back: Neck supple. No muscular tenderness.     Right lower leg: No edema.     Left lower leg: No edema.  Lymphadenopathy:     Cervical: No cervical adenopathy.     Upper Body:     Right upper body: No supraclavicular adenopathy.     Left upper body: No supraclavicular adenopathy.  Skin:    General: Skin is warm and dry.  Neurological:     General: No focal deficit present.     Mental Status: She is alert and oriented to person, place, and time.     Motor: No weakness.     Gait: Gait normal.  Psychiatric:        Mood and Affect: Mood normal.        Behavior: Behavior normal.        Judgment: Judgment normal.      No results found for any visits on 07/19/23.     Assessment & Plan:    Routine Health Maintenance and Physical Exam  Immunization History  Administered Date(s) Administered   Influenza, Seasonal, Injecte, Preservative Fre 03/08/2023   Influenza-Unspecified 03/02/2022   PFIZER(Purple Top)SARS-COV-2 Vaccination 06/03/2019, 06/24/2019, 05/02/2020   Tdap 08/28/2021    Health Maintenance  Topic Date Due   Medicare Annual Wellness (AWV)  Never done   HIV Screening  07/18/2024 (Originally 08/04/1975)   Colonoscopy  02/04/2024   MAMMOGRAM  10/25/2024   DTaP/Tdap/Td (2 - Td or Tdap) 08/29/2031   INFLUENZA VACCINE  Completed   Hepatitis C Screening  Completed   HPV VACCINES  Aged Out   COVID-19 Vaccine  Discontinued   Zoster Vaccines- Shingrix  Discontinued     Discussed health benefits of physical activity, and encouraged her to engage in regular exercise appropriate for her age and condition.  Problem List Items Addressed This Visit       Other   Overweight (BMI 25.0-29.9)   Labs ordered, further recommendations may be made based upon these results We also discussed lifestyle and patient will continue trying to adhere to weight watchers while also following a Mediterranean style diet.      Relevant Orders   CBC   Comprehensive metabolic panel   Hemoglobin A1c   Lipid panel   TSH   VITAMIN D  25 Hydroxy (Vit-D Deficiency, Fractures)   Urine Culture   Urinalysis, Routine w reflex microscopic   History of hyperparathyroidism   Will collect labs at next office visit, patient may come to get labs drawn before visit so we can discuss in person at visit.  Will collect calcium and vitamin D  levels.      Relevant Orders   CBC   Comprehensive metabolic panel   Hemoglobin A1c   Lipid panel   TSH   VITAMIN D  25 Hydroxy (Vit-D Deficiency, Fractures)   Urine Culture   Urinalysis, Routine w reflex microscopic   Hyperlipidemia   Chronic Patient like to focus on lifestyle management.  Encouraged her to consider Mediterranean style diet.  Handout provided.      Relevant Orders   CBC   Comprehensive metabolic panel   Hemoglobin A1c   Lipid panel  TSH   VITAMIN D  25 Hydroxy (Vit-D Deficiency, Fractures)   Urine Culture   Urinalysis, Routine w reflex microscopic   Prediabetes   Labs ordered, further recommendations may be made based upon these results Patient would like to consider getting CGM, although I do not think insurance will cover this based on her prediabetes diagnosis she could consider buying this cash.  I have sent prescription for freestyle libre 3+ to pharmacy for patient to consider filling if she would like to monitor blood sugars.      Relevant Medications   Continuous Glucose Sensor (FREESTYLE LIBRE 3 PLUS SENSOR)  MISC   Other Relevant Orders   CBC   Comprehensive metabolic panel   Hemoglobin A1c   Lipid panel   TSH   VITAMIN D  25 Hydroxy (Vit-D Deficiency, Fractures)   Urine Culture   Urinalysis, Routine w reflex microscopic   Encounter for general adult medical examination with abnormal findings - Primary   Healthy lifestyle, screening recommendations, vaccine recommendations discussed.  Handout provided.      Relevant Orders   CBC   Comprehensive metabolic panel   Hemoglobin A1c   Lipid panel   TSH   VITAMIN D  25 Hydroxy (Vit-D Deficiency, Fractures)   Urine Culture   Urinalysis, Routine w reflex microscopic   Hypercalcemia   History of hypercalcemia secondary to hyperparathyroidism, we will plan on checking calcium level and vitamin D  level at next lab draw.      Relevant Orders   CBC   Comprehensive metabolic panel   Hemoglobin A1c   Lipid panel   TSH   VITAMIN D  25 Hydroxy (Vit-D Deficiency, Fractures)   Urine Culture   Urinalysis, Routine w reflex microscopic   Return in about 3 months (around 10/16/2023) for F/U with Alexsus Papadopoulos.     Lauraine FORBES Pereyra, NP

## 2023-07-19 NOTE — Assessment & Plan Note (Signed)
 Chronic Patient like to focus on lifestyle management.  Encouraged her to consider Mediterranean style diet.  Handout provided.

## 2023-07-19 NOTE — Assessment & Plan Note (Signed)
 Labs ordered, further recommendations may be made based upon these results Patient would like to consider getting CGM, although I do not think insurance will cover this based on her prediabetes diagnosis she could consider buying this cash.  I have sent prescription for freestyle libre 3+ to pharmacy for patient to consider filling if she would like to monitor blood sugars.

## 2023-07-19 NOTE — Assessment & Plan Note (Signed)
 Will collect labs at next office visit, patient may come to get labs drawn before visit so we can discuss in person at visit.  Will collect calcium and vitamin D levels.

## 2023-07-19 NOTE — Assessment & Plan Note (Signed)
 History of hypercalcemia secondary to hyperparathyroidism, we will plan on checking calcium level and vitamin D level at next lab draw.

## 2023-07-19 NOTE — Assessment & Plan Note (Signed)
 Labs ordered, further recommendations may be made based upon these results We also discussed lifestyle and patient will continue trying to adhere to weight watchers while also following a Mediterranean style diet.

## 2023-08-20 ENCOUNTER — Ambulatory Visit: Payer: Commercial Managed Care - PPO | Admitting: "Endocrinology

## 2023-10-18 ENCOUNTER — Encounter: Payer: Self-pay | Admitting: Nurse Practitioner

## 2023-10-21 ENCOUNTER — Other Ambulatory Visit (INDEPENDENT_AMBULATORY_CARE_PROVIDER_SITE_OTHER)

## 2023-10-21 DIAGNOSIS — Z8639 Personal history of other endocrine, nutritional and metabolic disease: Secondary | ICD-10-CM

## 2023-10-21 DIAGNOSIS — E785 Hyperlipidemia, unspecified: Secondary | ICD-10-CM

## 2023-10-21 DIAGNOSIS — R7303 Prediabetes: Secondary | ICD-10-CM

## 2023-10-21 DIAGNOSIS — Z0001 Encounter for general adult medical examination with abnormal findings: Secondary | ICD-10-CM

## 2023-10-21 DIAGNOSIS — E663 Overweight: Secondary | ICD-10-CM | POA: Diagnosis not present

## 2023-10-21 LAB — URINALYSIS, ROUTINE W REFLEX MICROSCOPIC
Bilirubin Urine: NEGATIVE
Hgb urine dipstick: NEGATIVE
Ketones, ur: NEGATIVE
Nitrite: NEGATIVE
RBC / HPF: NONE SEEN (ref 0–?)
Specific Gravity, Urine: 1.02 (ref 1.000–1.030)
Total Protein, Urine: NEGATIVE
Urine Glucose: NEGATIVE
Urobilinogen, UA: 0.2 (ref 0.0–1.0)
pH: 6 (ref 5.0–8.0)

## 2023-10-21 LAB — COMPREHENSIVE METABOLIC PANEL WITH GFR
ALT: 19 U/L (ref 0–35)
AST: 15 U/L (ref 0–37)
Albumin: 4.2 g/dL (ref 3.5–5.2)
Alkaline Phosphatase: 52 U/L (ref 39–117)
BUN: 18 mg/dL (ref 6–23)
CO2: 27 meq/L (ref 19–32)
Calcium: 9.8 mg/dL (ref 8.4–10.5)
Chloride: 107 meq/L (ref 96–112)
Creatinine, Ser: 0.7 mg/dL (ref 0.40–1.20)
GFR: 90.89 mL/min (ref >60.00)
Glucose, Bld: 106 mg/dL — ABNORMAL HIGH (ref 70–99)
Potassium: 3.9 meq/L (ref 3.5–5.1)
Sodium: 141 meq/L (ref 135–145)
Total Bilirubin: 0.9 mg/dL (ref 0.2–1.2)
Total Protein: 6.6 g/dL (ref 6.0–8.3)

## 2023-10-21 LAB — CBC
HCT: 37.8 % (ref 36.0–46.0)
Hemoglobin: 12.7 g/dL (ref 12.0–15.0)
MCHC: 33.6 g/dL (ref 30.0–36.0)
MCV: 90 fl (ref 78.0–100.0)
Platelets: 221 10*3/uL (ref 150.0–400.0)
RBC: 4.2 Mil/uL (ref 3.87–5.11)
RDW: 13.4 % (ref 11.5–15.5)
WBC: 5.7 10*3/uL (ref 4.0–10.5)

## 2023-10-21 LAB — HEMOGLOBIN A1C: Hgb A1c MFr Bld: 6.1 % (ref 4.6–6.5)

## 2023-10-21 LAB — VITAMIN D 25 HYDROXY (VIT D DEFICIENCY, FRACTURES): VITD: 37.62 ng/mL (ref 30.00–100.00)

## 2023-10-21 LAB — LIPID PANEL
Cholesterol: 224 mg/dL — ABNORMAL HIGH (ref 0–200)
HDL: 62.5 mg/dL (ref >39.00)
LDL Cholesterol: 148 mg/dL — ABNORMAL HIGH (ref 0–99)
NonHDL: 161.69
Total CHOL/HDL Ratio: 4
Triglycerides: 67 mg/dL (ref 0.0–149.0)
VLDL: 13.4 mg/dL (ref 0.0–40.0)

## 2023-10-21 LAB — TSH: TSH: 3.35 u[IU]/mL (ref 0.35–5.50)

## 2023-10-22 LAB — URINE CULTURE: Result:: NO GROWTH

## 2023-10-25 ENCOUNTER — Ambulatory Visit: Payer: 59 | Admitting: Nurse Practitioner

## 2023-10-25 VITALS — BP 122/84 | HR 64 | Temp 98.1°F | Ht 69.0 in | Wt 184.0 lb

## 2023-10-25 DIAGNOSIS — R7303 Prediabetes: Secondary | ICD-10-CM

## 2023-10-25 DIAGNOSIS — E785 Hyperlipidemia, unspecified: Secondary | ICD-10-CM | POA: Diagnosis not present

## 2023-10-25 DIAGNOSIS — Z1231 Encounter for screening mammogram for malignant neoplasm of breast: Secondary | ICD-10-CM | POA: Insufficient documentation

## 2023-10-25 MED ORDER — FREESTYLE LIBRE 3 PLUS SENSOR MISC: 3 refills | Status: AC

## 2023-10-25 NOTE — Assessment & Plan Note (Signed)
 Patient is due for screening mammogram, will order this today.

## 2023-10-25 NOTE — Assessment & Plan Note (Signed)
 Chronic, stable Per shared decision making we will hold off on pharmacological treatment at this time.

## 2023-10-25 NOTE — Patient Instructions (Addendum)
FIA - Females in Action ?

## 2023-10-25 NOTE — Progress Notes (Signed)
 Established Patient Office Visit  Subjective   Patient ID: Brianna Morgan, female    DOB: Dec 12, 1960  Age: 63 y.o. MRN: [id number]  Chief Complaint  Patient presents with   Hyperlipidemia    Overweight/HLD/Prediabetes: Patient has been focusing on diet specifically counting macros and trying to increase physical activity level to help with weight loss.  Weight has been stable over the last few months.  Had labs completed a few days ago which identified LDL stable at 148, she had apolipoprotein B checked in December 2024 and it was elevated at 112.  She did undergo CT cardiac score which identified coronary calcium score of 0.  She is currently not on cholesterol-lowering medication.  A1c from a few days ago 6.1.        Objective:     BP 122/84   Pulse 64   Temp 98.1 F (36.7 C) (Temporal)   Ht 5\' 9"  (1.753 m)   Wt 184 lb (83.5 kg)   SpO2 95%   BMI 27.17 kg/m  BP Readings from Last 3 Encounters:  10/25/23 122/84  07/19/23 110/74  05/20/23 120/82   Wt Readings from Last 3 Encounters:  10/25/23 184 lb (83.5 kg)  07/19/23 182 lb 4 oz (82.7 kg)  05/20/23 181 lb (82.1 kg)      Physical Exam Vitals reviewed.  Constitutional:      General: She is not in acute distress.    Appearance: Normal appearance.  HENT:     Head: Normocephalic and atraumatic.  Neck:     Vascular: No carotid bruit.  Cardiovascular:     Rate and Rhythm: Normal rate and regular rhythm.     Pulses: Normal pulses.     Heart sounds: Normal heart sounds.  Pulmonary:     Effort: Pulmonary effort is normal.     Breath sounds: Normal breath sounds.  Skin:    General: Skin is warm and dry.  Neurological:     General: No focal deficit present.     Mental Status: She is alert and oriented to person, place, and time.  Psychiatric:        Mood and Affect: Mood normal.        Behavior: Behavior normal.        Judgment: Judgment normal.      No results found for any visits on  10/25/23.    The 10-year ASCVD risk score (Arnett DK, et al., 2019) is: 5.1%    Assessment & Plan:   Problem List Items Addressed This Visit       Other   Hyperlipidemia   Chronic, stable Per shared decision making we will hold off on pharmacological treatment at this time.        Prediabetes - Primary   Chronic A1c stable at 6.1 Patient would like to try CGM she does understand this likely would be cash pay.  Prescription for freestyle libre 3+ sensors sent to pharmacy. She was also encouraged to continue focusing on healthy diet with focus on vegetables, whole grains, lean proteins and avoidance of processed carbohydrates. She was encouraged to continue exercising regularly as well.      Relevant Medications   Continuous Glucose Sensor (FREESTYLE LIBRE 3 PLUS SENSOR) MISC   Encounter for screening mammogram for malignant neoplasm of breast   Patient is due for screening mammogram, will order this today.      Relevant Orders   MM DIGITAL SCREENING BILATERAL    Return in about 6 months (  around 04/26/2024) for F/U with Kaymen Adrian.    Zorita Hiss, NP

## 2023-10-25 NOTE — Assessment & Plan Note (Signed)
 Chronic A1c stable at 6.1 Patient would like to try CGM she does understand this likely would be cash pay.  Prescription for freestyle libre 3+ sensors sent to pharmacy. She was also encouraged to continue focusing on healthy diet with focus on vegetables, whole grains, lean proteins and avoidance of processed carbohydrates. She was encouraged to continue exercising regularly as well.

## 2023-11-08 ENCOUNTER — Ambulatory Visit
Admission: RE | Admit: 2023-11-08 | Discharge: 2023-11-08 | Disposition: A | Discharge status: Home / Self Care | Source: Ambulatory Visit | Attending: Nurse Practitioner | Admitting: Nurse Practitioner

## 2023-11-08 DIAGNOSIS — Z1231 Encounter for screening mammogram for malignant neoplasm of breast: Secondary | ICD-10-CM

## 2024-01-14 ENCOUNTER — Telehealth: Payer: Self-pay | Admitting: Nurse Practitioner

## 2024-01-14 NOTE — Telephone Encounter (Unsigned)
 Copied from CRM [id number]. Topic: Clinical - Medication Refill >> Jan 14, 2024  9:25 AM Henretta I wrote: Medication: triamcinolone  cream (KENALOG ) 0.1 %   Patient would like to know if they can be prescribed again and sent into pharmacy below   Has the patient contacted their pharmacy? Yes (Agent: If no, request that the patient contact the pharmacy for the refill. If patient does not wish to contact the pharmacy document the reason why and proceed with request.) (Agent: If yes, when and what did the pharmacy advise?)  This is the patient's preferred pharmacy:  [id number] - [place], [place] - [address] [facility] [place] [address] Phone: [phone] Fax: [phone]  Is this the correct pharmacy for this prescription? Yes If no, delete pharmacy and type the correct one.   Has the prescription been filled recently? No  Is the patient out of the medication? Yes  Has the patient been seen for an appointment in the last year OR does the patient have an upcoming appointment? Yes  Can we respond through MyChart? Yes  Agent: Please be advised that Rx refills may take up to 3 business days. We ask that you follow-up with your pharmacy.

## 2024-01-16 ENCOUNTER — Other Ambulatory Visit: Payer: Self-pay | Admitting: Family

## 2024-01-16 MED ORDER — TRIAMCINOLONE ACETONIDE 0.1 % EX CREA: 1.0000 | TOPICAL_CREAM | Freq: Two times a day (BID) | CUTANEOUS | 0 refills | Status: AC

## 2024-04-29 IMAGING — MG MM DIGITAL SCREENING BILAT W/ TOMO AND CAD
8 series · 8 of 24 positions shown · non-contrast
Comparison: Previous exam(s).

CLINICAL DATA: Screening.

EXAM:
DIGITAL SCREENING BILATERAL MAMMOGRAM WITH TOMOSYNTHESIS AND CAD
TECHNIQUE: Bilateral screening digital craniocaudal and mediolateral oblique
mammograms were obtained. Bilateral screening digital breast
tomosynthesis was performed. The images were evaluated with
computer-aided detection.

[R CC synth-2D]
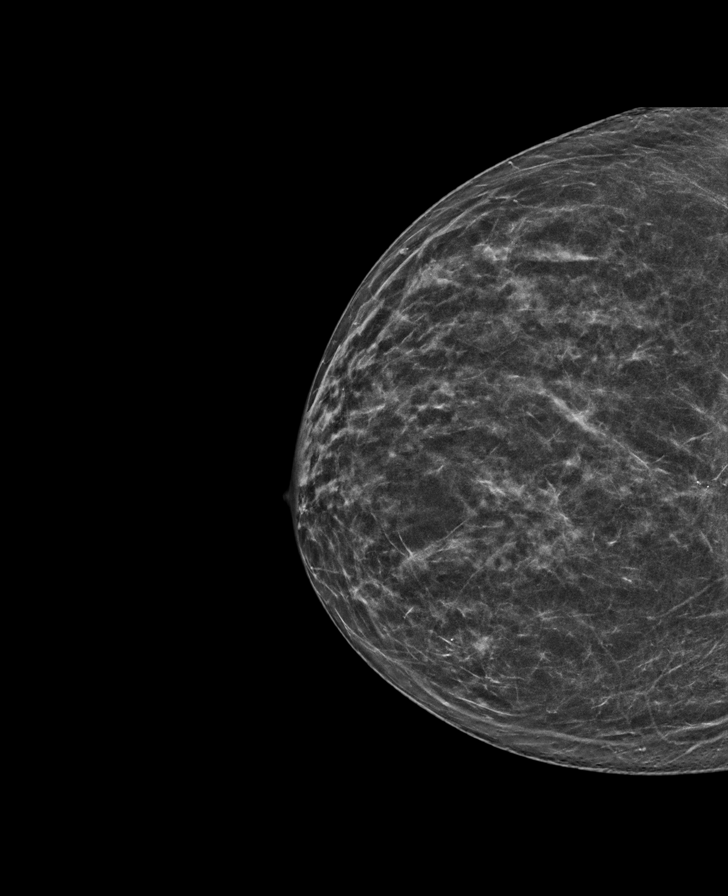

[L CC synth-2D]
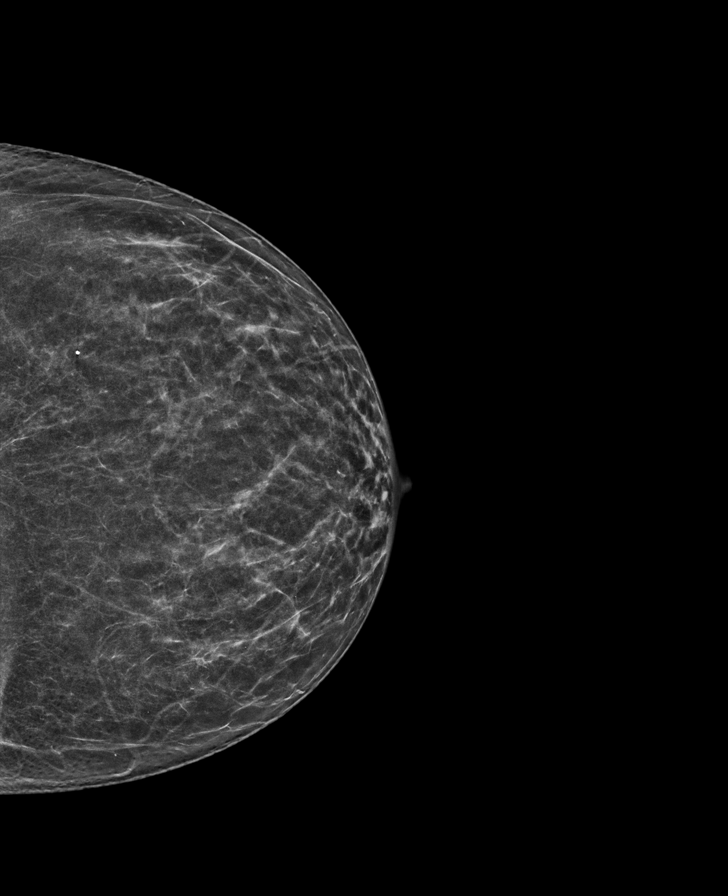

[L MLO synth-2D]
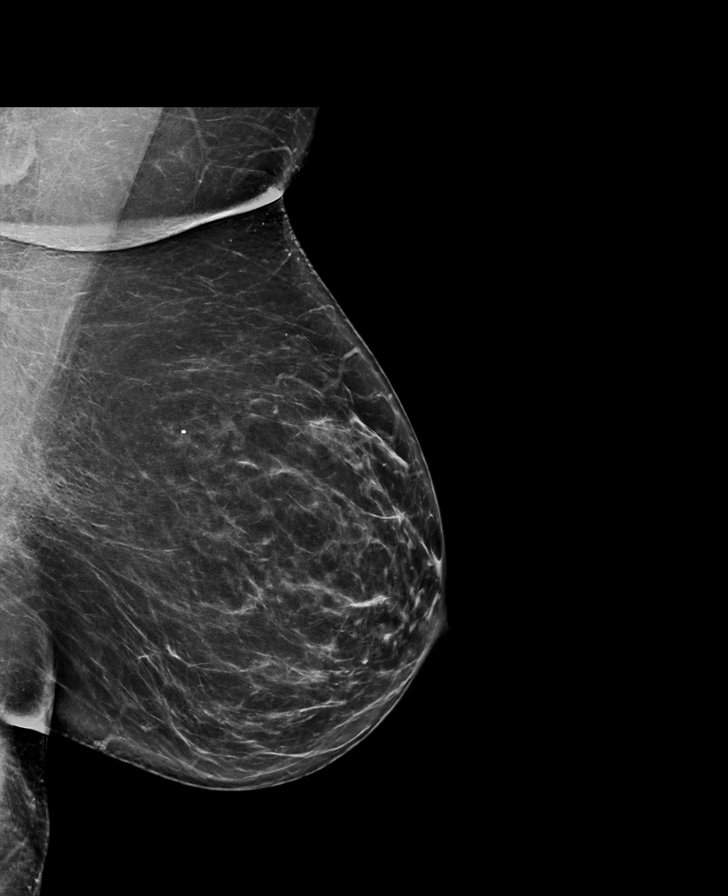

[R MLO synth-2D]
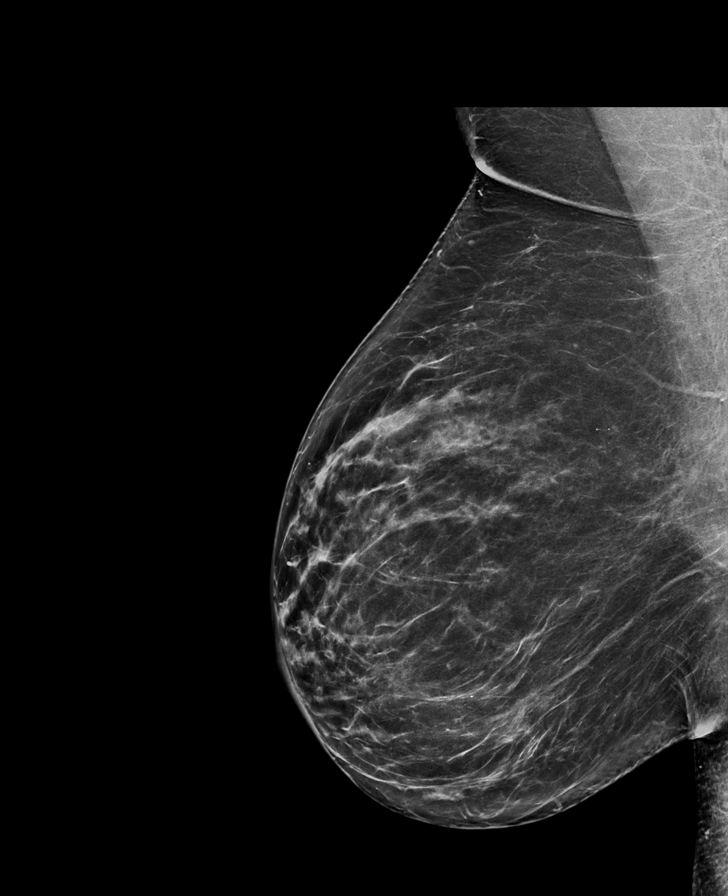

[L MLO tomo · tomo slice 39/78.0]
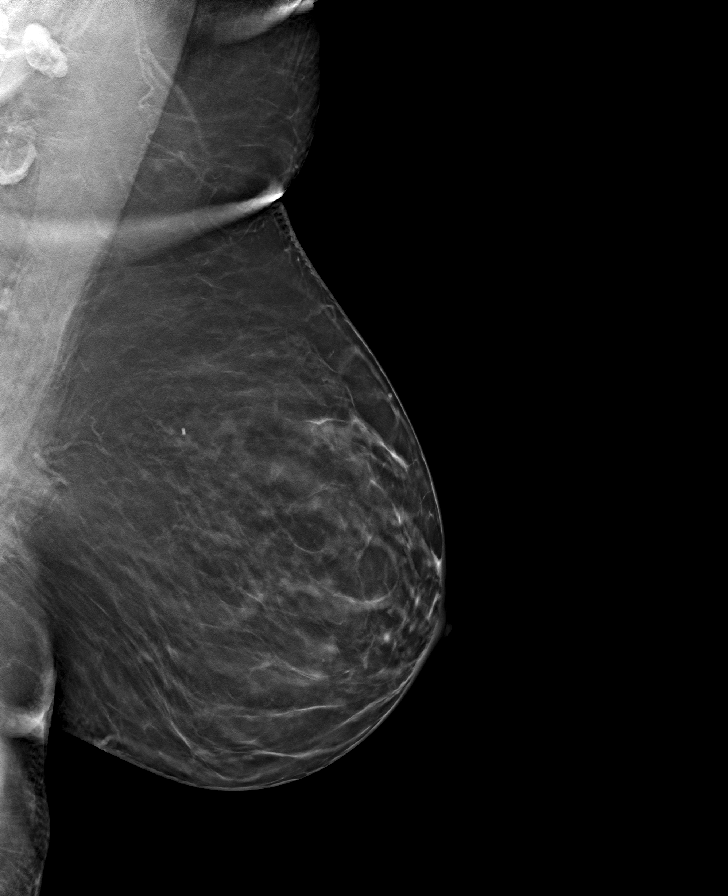

[R MLO tomo · tomo slice 39/77.0]
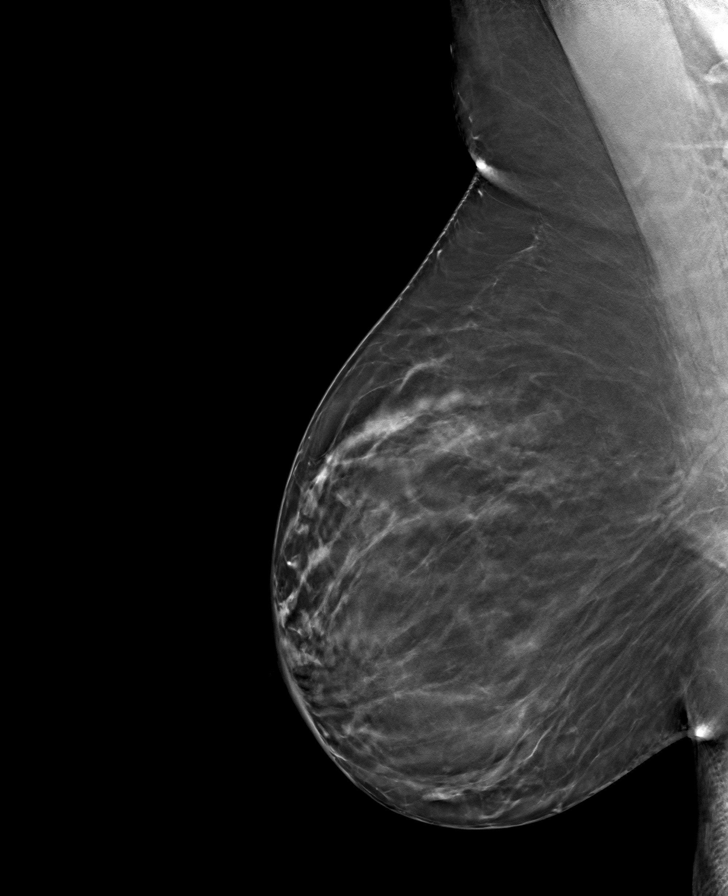

[R CC tomo · tomo slice 30/59.0]
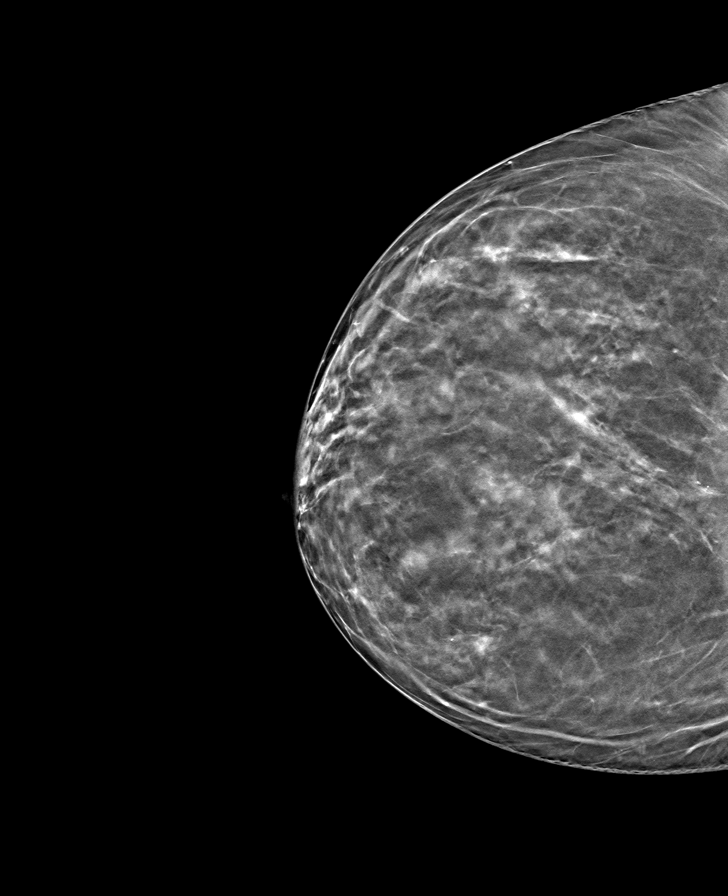

[L CC tomo · tomo slice 29/58.0]
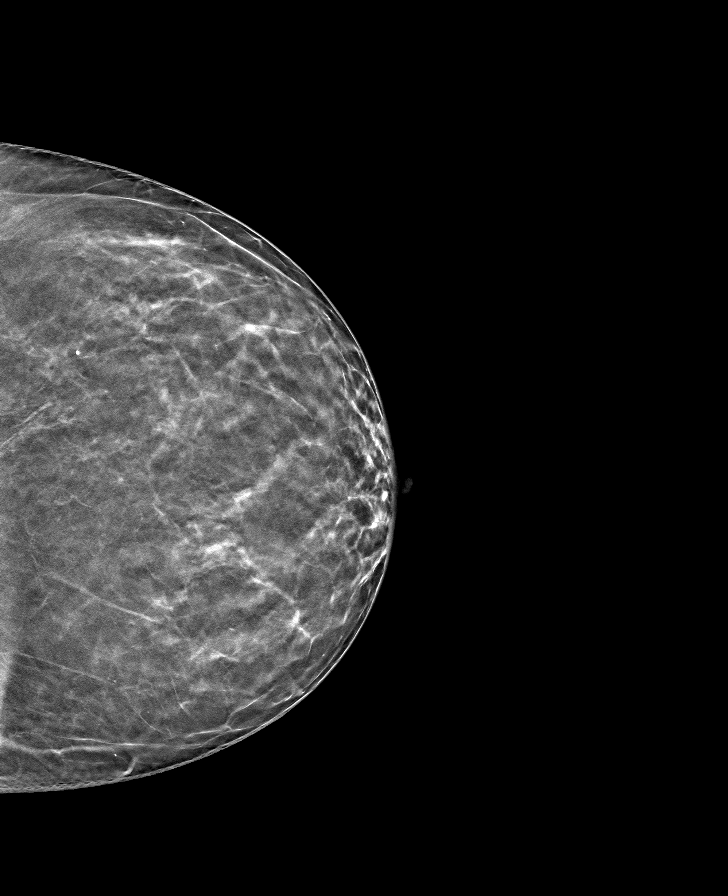

[8 of 24 positions shown; findings below may reference images not displayed]

ACR Breast Density Category b: There are scattered areas of
fibroglandular density.
FINDINGS: In the right breast, a possible asymmetry warrants further
evaluation. In the left breast, no findings suspicious for
malignancy.
IMPRESSION: Further evaluation is suggested for possible asymmetry in the right
breast.

RECOMMENDATION:
Diagnostic mammogram and possibly ultrasound of the right breast.
(Code:[id number])

The patient will be contacted regarding the findings, and additional
imaging will be scheduled.

BI-RADS CATEGORY  0: Incomplete. Need additional imaging evaluation
and/or prior mammograms for comparison.

## 2024-05-01 ENCOUNTER — Ambulatory Visit: Admitting: Nurse Practitioner

## 2024-05-01 VITALS — BP 120/80 | HR 68 | Temp 98.0°F | Ht 69.0 in | Wt 177.0 lb

## 2024-05-01 DIAGNOSIS — F419 Anxiety disorder, unspecified: Secondary | ICD-10-CM | POA: Diagnosis not present

## 2024-05-01 DIAGNOSIS — R051 Acute cough: Secondary | ICD-10-CM | POA: Diagnosis not present

## 2024-05-01 DIAGNOSIS — R7303 Prediabetes: Secondary | ICD-10-CM

## 2024-05-01 DIAGNOSIS — Z13820 Encounter for screening for osteoporosis: Secondary | ICD-10-CM

## 2024-05-01 DIAGNOSIS — Z23 Encounter for immunization: Secondary | ICD-10-CM

## 2024-05-01 DIAGNOSIS — I7090 Unspecified atherosclerosis: Secondary | ICD-10-CM

## 2024-05-01 DIAGNOSIS — Z780 Asymptomatic menopausal state: Secondary | ICD-10-CM | POA: Diagnosis not present

## 2024-05-01 LAB — POCT INFLUENZA A/B
Influenza A, POC: NEGATIVE
Influenza B, POC: NEGATIVE

## 2024-05-01 LAB — POC COVID19 BINAXNOW: SARS Coronavirus 2 Ag: NEGATIVE

## 2024-05-01 MED ORDER — LORAZEPAM 0.5 MG PO TABS: 0.5000 mg | ORAL_TABLET | Freq: Every day | ORAL | 0 refills | Status: AC | PRN

## 2024-05-01 NOTE — Assessment & Plan Note (Signed)
 Health Maintenance: - Flu shot administered, VIS provided

## 2024-05-01 NOTE — Assessment & Plan Note (Signed)
 Hyperlipidemia Managed with lifestyle modifications. Previous CT scan showed zero calcium score and 5% risk score. - Ordered lipid panel. - Continue lifestyle modifications.

## 2024-05-01 NOTE — Assessment & Plan Note (Signed)
 Prediabetes Weight decreased to 177 lbs, BMI 26.1. Following lower carbohydrate diet. Resolved GI symptoms after discontinuing Align probiotic. - Ordered A1c test with lipid panel. - Continue lower carbohydrate diet

## 2024-05-01 NOTE — Assessment & Plan Note (Signed)
 Episodic insomnia/Anxiety Occasional insomnia and anxiety due to flying managed with Ativan  as needed. - Refilled Ativan  prescription.

## 2024-05-01 NOTE — Progress Notes (Signed)
 Established Patient Office Visit  Subjective   Patient ID: Brianna Morgan, female    DOB: 09-Jan-1961  Age: 63 y.o. MRN: [id number]  Chief Complaint  Patient presents with   Hyperlipidemia    Discussed the use of AI scribe software for clinical note transcription with the patient, who gave verbal consent to proceed.  History of Present Illness Brianna Morgan is a 63 year old female who presents for a follow-up visit regarding her prediabetes management and upcoming knee replacement surgery.  Prediabetes management - Prediabetes managed with continuous glucose monitor and low-carbohydrate diet - Weight decreased from 184 lbs to 177 lbs - Current BMI is 26.1  Preoperative status for knee replacement - Scheduled for knee replacement surgery next Tuesday  Atherosclerosis - Last LDL 148 - Diet controlled - CT cardiac score was 0 - Has lost weight as stated above with lifestyle changes  Recent viral illness - Onset of viral symptoms on Sunday - Symptoms improving by today - No chest pain or difficulty breathing      ROS: see HPI    Objective:     BP 120/80   Pulse 68   Temp 98 F (36.7 C) (Temporal)   Ht 5' 9 (1.753 m)   Wt 177 lb (80.3 kg)   SpO2 97%   BMI 26.14 kg/m  BP Readings from Last 3 Encounters:  05/01/24 120/80  10/25/23 122/84  07/19/23 110/74   Wt Readings from Last 3 Encounters:  05/01/24 177 lb (80.3 kg)  10/25/23 184 lb (83.5 kg)  07/19/23 182 lb 4 oz (82.7 kg)      Physical Exam Vitals reviewed.  Constitutional:      General: She is not in acute distress.    Appearance: Normal appearance.  HENT:     Head: Normocephalic and atraumatic.  Cardiovascular:     Rate and Rhythm: Normal rate and regular rhythm.     Pulses: Normal pulses.     Heart sounds: Normal heart sounds.  Pulmonary:     Effort: Pulmonary effort is normal.     Breath sounds: Normal breath sounds.  Skin:    General: Skin is warm and dry.  Neurological:      General: No focal deficit present.     Mental Status: She is alert and oriented to person, place, and time.  Psychiatric:        Mood and Affect: Mood normal.        Behavior: Behavior normal.        Judgment: Judgment normal.      Results for orders placed or performed in visit on 05/01/24  POC COVID-19 BinaxNow  Result Value Ref Range   SARS Coronavirus 2 Ag Negative Negative  POCT Influenza A/B  Result Value Ref Range   Influenza A, POC Negative Negative   Influenza B, POC Negative Negative      The 10-year ASCVD risk score (Arnett DK, et al., 2019) is: 5%    Assessment & Plan:   Problem List Items Addressed This Visit       Cardiovascular and Mediastinum   Atherosclerosis - Primary   Relevant Orders   Lipid panel   Comprehensive metabolic panel with GFR     Other   Anxiety with flying   Relevant Medications   LORazepam  (ATIVAN ) 0.5 MG tablet   Prediabetes   Relevant Orders   Hemoglobin A1c   Need for vaccination   Relevant Orders   Flu vaccine HIGH DOSE PF(Fluzone Trivalent) (  Completed)   Other Visit Diagnoses       Osteoporosis screening       Relevant Orders   DG Bone Density     Menopause       Relevant Orders   DG Bone Density     Acute cough       Relevant Orders   POC COVID-19 BinaxNow (Completed)   POCT Influenza A/B (Completed)      Assessment and Plan Assessment & Plan Prediabetes Weight decreased to 177 lbs, BMI 26.1. Following lower carbohydrate diet. Resolved GI symptoms after discontinuing Align probiotic. - Ordered A1c test with lipid panel. - Continue lower carbohydrate diet.  Hyperlipidemia Managed with lifestyle modifications. Previous CT scan showed zero calcium score and 5% risk score. - Ordered lipid panel. - Continue lifestyle modifications.  Viral upper respiratory infection Symptoms consistent with viral infection, improvement noted. Discussed potential bacterial infection if symptoms worsen. - Tested for COVID  and flu. Both were negative - Did not have RSV test available to test patient today  Episodic insomnia/Anxiety Occasional insomnia and anxiety due to flying managed with Ativan  as needed. - Refilled Ativan  prescription.  Health Maintenance: - Flu shot administered, VIS provided    Return in about 6 months (around 10/29/2024) for F/U with Lauraine.    Lauraine FORBES Pereyra, NP

## 2024-05-28 ENCOUNTER — Encounter: Payer: Self-pay | Admitting: Nurse Practitioner

## 2024-06-12 ENCOUNTER — Other Ambulatory Visit: Payer: Self-pay | Admitting: Nurse Practitioner

## 2024-06-15 ENCOUNTER — Other Ambulatory Visit: Payer: Self-pay | Admitting: Nurse Practitioner

## 2024-06-15 DIAGNOSIS — I77819 Aortic ectasia, unspecified site: Secondary | ICD-10-CM

## 2024-06-18 ENCOUNTER — Other Ambulatory Visit: Payer: Self-pay | Admitting: Nurse Practitioner

## 2024-06-18 DIAGNOSIS — A6000 Herpesviral infection of urogenital system, unspecified: Secondary | ICD-10-CM

## 2024-07-16 ENCOUNTER — Ambulatory Visit
Admission: RE | Admit: 2024-07-16 | Discharge: 2024-07-16 | Disposition: A | Source: Ambulatory Visit | Attending: Nurse Practitioner | Admitting: Nurse Practitioner

## 2024-07-16 DIAGNOSIS — I77819 Aortic ectasia, unspecified site: Secondary | ICD-10-CM

## 2024-07-16 MED ORDER — GADOPICLENOL 0.5 MMOL/ML IV SOLN
7.5000 mL | Freq: Once | INTRAVENOUS | Status: AC | PRN
  Administered 2024-07-16: 7.5 mL via INTRAVENOUS
